# Patient Record
Sex: Female | Born: 1975
Health system: Southern US, Community
[De-identification: ages and names within clinical notes are randomized; demographics above are authoritative.]

## PROBLEM LIST (undated history)

## (undated) ENCOUNTER — Inpatient Hospital Stay (HOSPITAL_COMMUNITY): Payer: Self-pay

## (undated) DIAGNOSIS — R7989 Other specified abnormal findings of blood chemistry: Secondary | ICD-10-CM

## (undated) DIAGNOSIS — M545 Low back pain, unspecified: Secondary | ICD-10-CM

## (undated) DIAGNOSIS — M1990 Unspecified osteoarthritis, unspecified site: Secondary | ICD-10-CM

## (undated) DIAGNOSIS — M5412 Radiculopathy, cervical region: Secondary | ICD-10-CM

## (undated) DIAGNOSIS — I82409 Acute embolism and thrombosis of unspecified deep veins of unspecified lower extremity: Secondary | ICD-10-CM

## (undated) DIAGNOSIS — R200 Anesthesia of skin: Secondary | ICD-10-CM

## (undated) DIAGNOSIS — F329 Major depressive disorder, single episode, unspecified: Secondary | ICD-10-CM

## (undated) DIAGNOSIS — G5600 Carpal tunnel syndrome, unspecified upper limb: Secondary | ICD-10-CM

## (undated) DIAGNOSIS — R269 Unspecified abnormalities of gait and mobility: Secondary | ICD-10-CM

## (undated) DIAGNOSIS — R0609 Other forms of dyspnea: Secondary | ICD-10-CM

## (undated) DIAGNOSIS — R0600 Dyspnea, unspecified: Secondary | ICD-10-CM

## (undated) DIAGNOSIS — M19041 Primary osteoarthritis, right hand: Secondary | ICD-10-CM

## (undated) DIAGNOSIS — R350 Frequency of micturition: Secondary | ICD-10-CM

## (undated) DIAGNOSIS — R112 Nausea with vomiting, unspecified: Secondary | ICD-10-CM

## (undated) DIAGNOSIS — F32A Depression, unspecified: Secondary | ICD-10-CM

## (undated) DIAGNOSIS — F419 Anxiety disorder, unspecified: Secondary | ICD-10-CM

## (undated) DIAGNOSIS — Z9889 Other specified postprocedural states: Secondary | ICD-10-CM

## (undated) DIAGNOSIS — M79602 Pain in left arm: Secondary | ICD-10-CM

## (undated) DIAGNOSIS — M0579 Rheumatoid arthritis with rheumatoid factor of multiple sites without organ or systems involvement: Secondary | ICD-10-CM

## (undated) DIAGNOSIS — R760 Raised antibody titer: Secondary | ICD-10-CM

## (undated) DIAGNOSIS — Z789 Other specified health status: Secondary | ICD-10-CM

## (undated) HISTORY — DX: Dyspnea, unspecified: R06.00

## (undated) HISTORY — DX: Low back pain, unspecified: M54.50

## (undated) HISTORY — DX: Other forms of dyspnea: R06.09

## (undated) HISTORY — DX: Depression, unspecified: F32.A

## (undated) HISTORY — DX: Anxiety disorder, unspecified: F41.9

## (undated) HISTORY — PX: HERNIA REPAIR: SHX51

---
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
## 2002-05-05 ENCOUNTER — Encounter: Payer: Self-pay | Admitting: Emergency Medicine

## 2002-05-05 ENCOUNTER — Emergency Department (HOSPITAL_COMMUNITY): Admission: EM | Admit: 2002-05-05 | Discharge: 2002-05-05 | Payer: Self-pay | Admitting: Emergency Medicine

## 2003-09-06 ENCOUNTER — Emergency Department (HOSPITAL_COMMUNITY): Admission: EM | Admit: 2003-09-06 | Discharge: 2003-09-07 | Payer: Self-pay | Admitting: Emergency Medicine

## 2004-05-29 ENCOUNTER — Emergency Department (HOSPITAL_COMMUNITY): Admission: EM | Admit: 2004-05-29 | Discharge: 2004-05-29 | Payer: Self-pay | Admitting: Family Medicine

## 2004-06-02 ENCOUNTER — Other Ambulatory Visit: Admission: RE | Admit: 2004-06-02 | Discharge: 2004-06-02 | Payer: Self-pay | Admitting: Obstetrics and Gynecology

## 2005-11-23 ENCOUNTER — Emergency Department (HOSPITAL_COMMUNITY): Admission: EM | Admit: 2005-11-23 | Discharge: 2005-11-23 | Payer: Self-pay | Admitting: Emergency Medicine

## 2006-09-18 ENCOUNTER — Emergency Department (HOSPITAL_COMMUNITY): Admission: EM | Admit: 2006-09-18 | Discharge: 2006-09-19 | Payer: Self-pay | Admitting: Emergency Medicine

## 2006-11-12 ENCOUNTER — Other Ambulatory Visit: Admission: RE | Admit: 2006-11-12 | Discharge: 2006-11-12 | Payer: Self-pay | Admitting: Obstetrics and Gynecology

## 2007-01-04 ENCOUNTER — Emergency Department (HOSPITAL_COMMUNITY): Admission: EM | Admit: 2007-01-04 | Discharge: 2007-01-04 | Payer: Self-pay | Admitting: Emergency Medicine

## 2007-02-14 ENCOUNTER — Ambulatory Visit (HOSPITAL_COMMUNITY): Admission: RE | Admit: 2007-02-14 | Discharge: 2007-02-14 | Payer: Self-pay | Admitting: Obstetrics and Gynecology

## 2007-04-01 ENCOUNTER — Inpatient Hospital Stay (HOSPITAL_COMMUNITY): Admission: AD | Admit: 2007-04-01 | Discharge: 2007-04-01 | Payer: Self-pay | Admitting: Obstetrics & Gynecology

## 2007-05-14 ENCOUNTER — Ambulatory Visit (HOSPITAL_COMMUNITY): Admission: RE | Admit: 2007-05-14 | Discharge: 2007-05-14 | Payer: Self-pay | Admitting: Obstetrics & Gynecology

## 2007-06-05 ENCOUNTER — Inpatient Hospital Stay (HOSPITAL_COMMUNITY): Admission: AD | Admit: 2007-06-05 | Discharge: 2007-06-05 | Payer: Self-pay | Admitting: Obstetrics and Gynecology

## 2007-06-21 ENCOUNTER — Inpatient Hospital Stay (HOSPITAL_COMMUNITY): Admission: AD | Admit: 2007-06-21 | Discharge: 2007-06-21 | Payer: Self-pay | Admitting: Obstetrics and Gynecology

## 2007-07-03 ENCOUNTER — Inpatient Hospital Stay (HOSPITAL_COMMUNITY): Admission: AD | Admit: 2007-07-03 | Discharge: 2007-07-03 | Payer: Self-pay | Admitting: Obstetrics and Gynecology

## 2007-07-14 ENCOUNTER — Inpatient Hospital Stay (HOSPITAL_COMMUNITY): Admission: AD | Admit: 2007-07-14 | Discharge: 2007-07-14 | Payer: Self-pay | Admitting: Obstetrics and Gynecology

## 2007-07-21 ENCOUNTER — Inpatient Hospital Stay (HOSPITAL_COMMUNITY): Admission: RE | Admit: 2007-07-21 | Discharge: 2007-07-24 | Payer: Self-pay | Admitting: Obstetrics and Gynecology

## 2007-11-28 ENCOUNTER — Emergency Department (HOSPITAL_COMMUNITY): Admission: EM | Admit: 2007-11-28 | Discharge: 2007-11-28 | Payer: Self-pay | Admitting: Emergency Medicine

## 2008-06-05 IMAGING — US US TRANSVAGINAL NON-OB
1 series · 14 of 25 positions shown · non-contrast
Comparison: None

CLINICAL DATA: Lower abdominal pain

TRANSABDOMINAL AND TRANSVAGINAL PELVIC ULTRASOUND
TECHNIQUE: Both transabdominal and transvaginal ultrasound examinations of the
pelvis were performed including evaluation of the uterus, ovaries, adnexal
regions, and pelvic cul-de-sac.

[Series 1: unknown · 0.27mm/px · 14 of 41 slices shown]
[im 1/41]
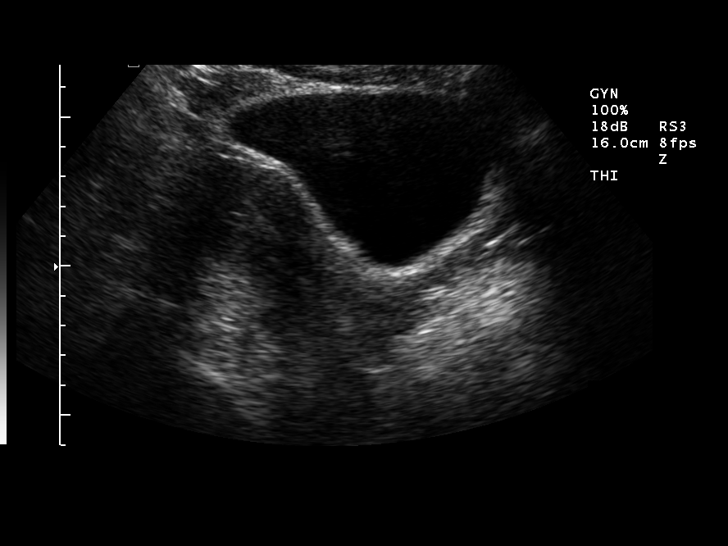
[im 4/41]
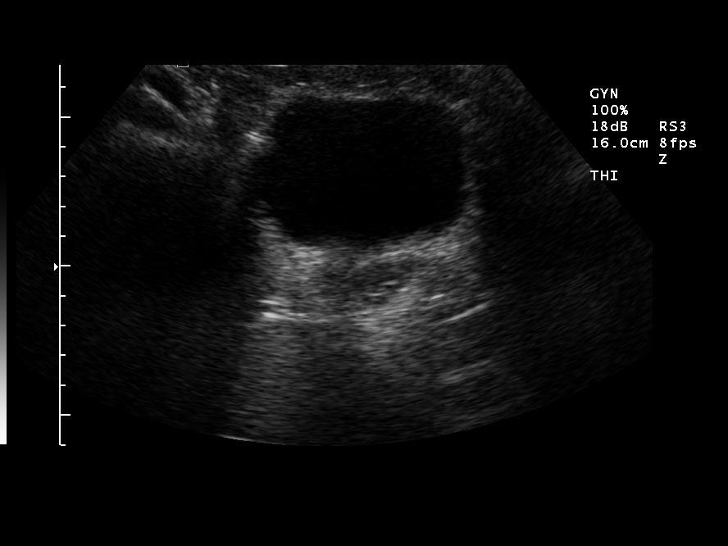
[im 7/41]
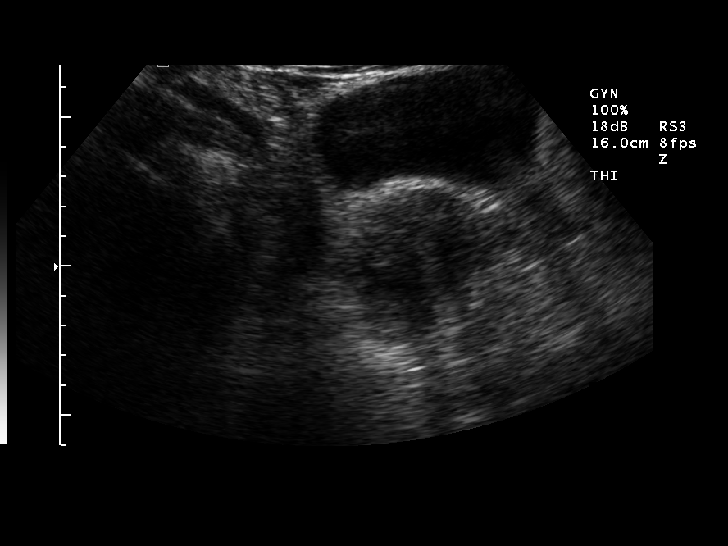
[im 11/41]
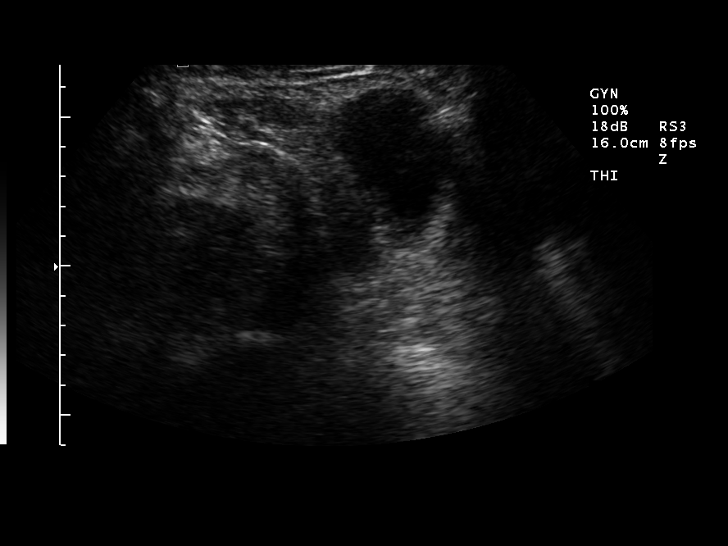
[im 14/41]
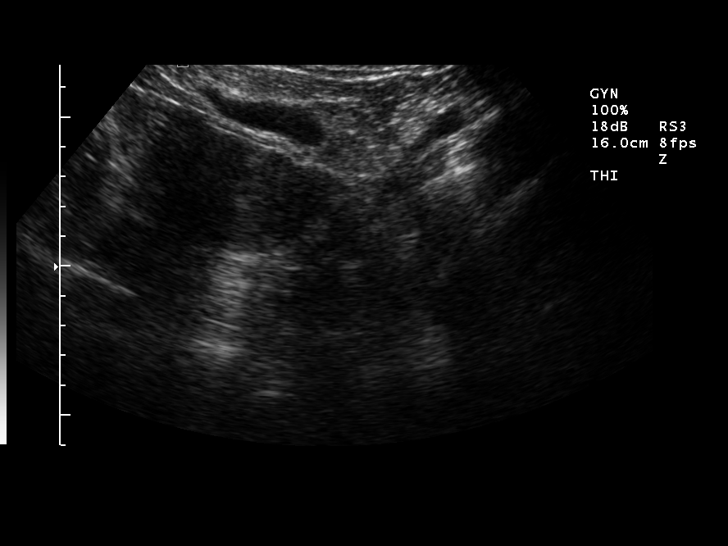
[im 16/41]
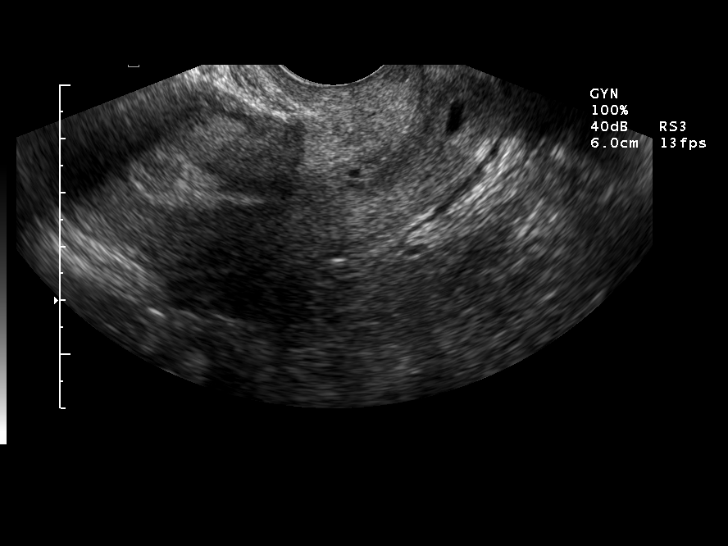
[im 19/41]
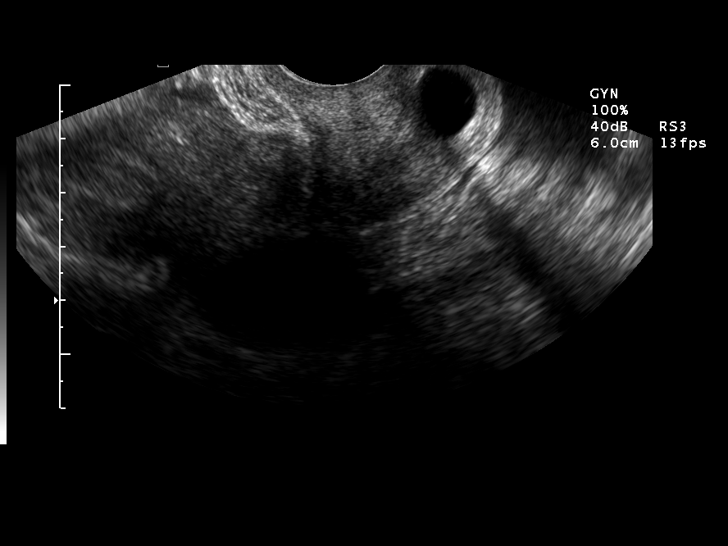
[im 22/41]
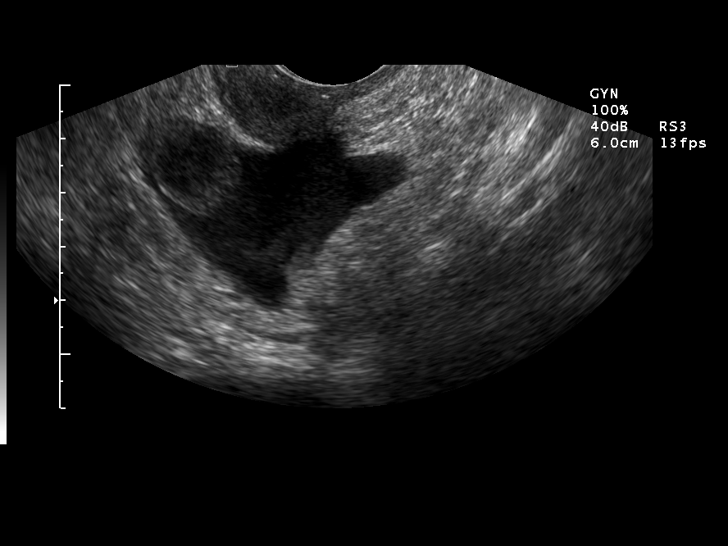
[im 26/41]
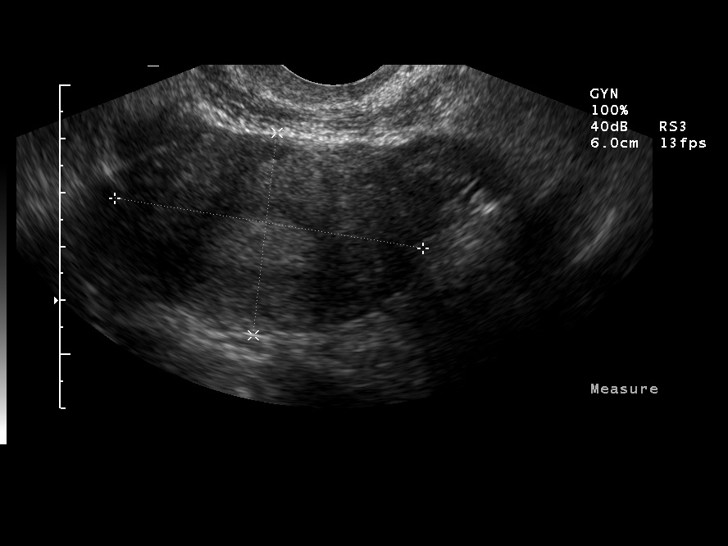
[im 27/41]
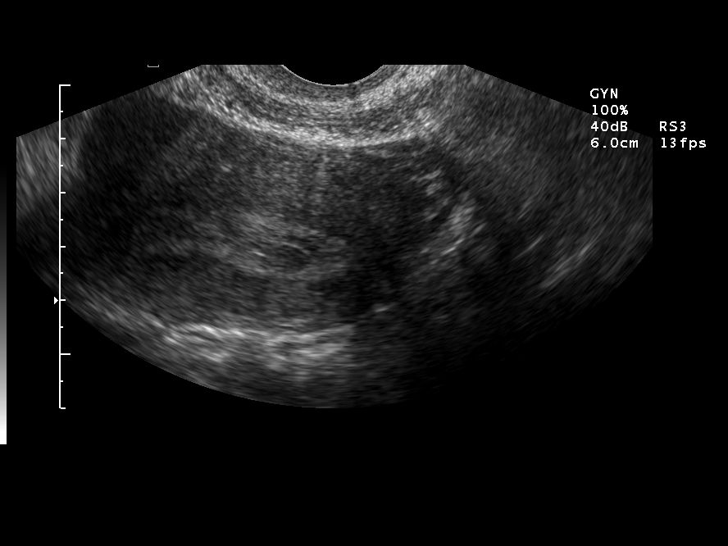
[im 31/41]
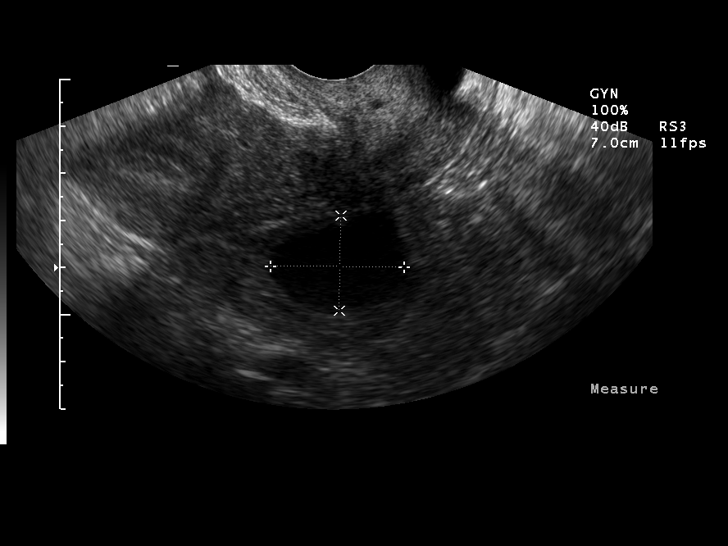
[im 34/41]
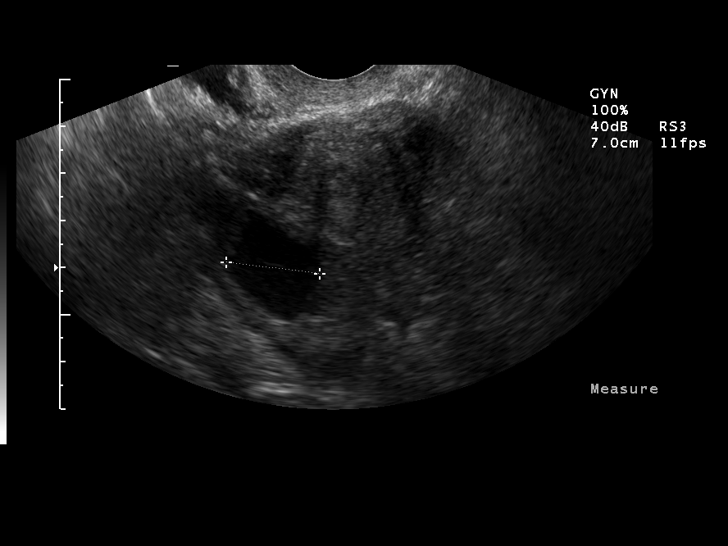
[im 37/41]
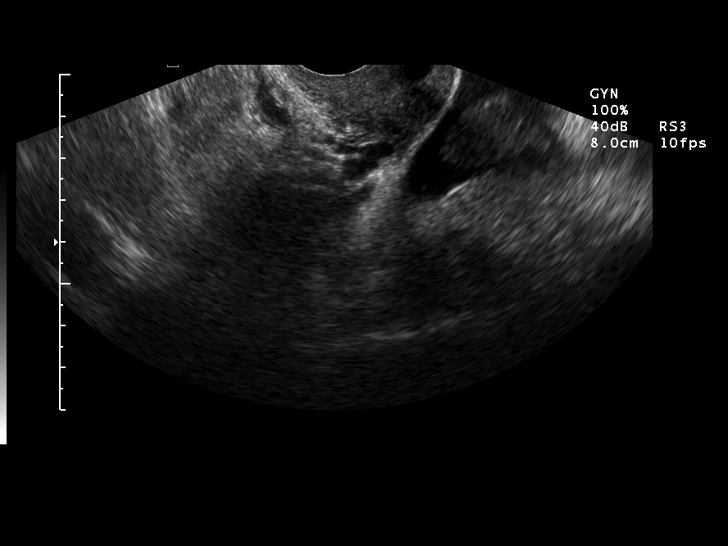
[im 41/41]
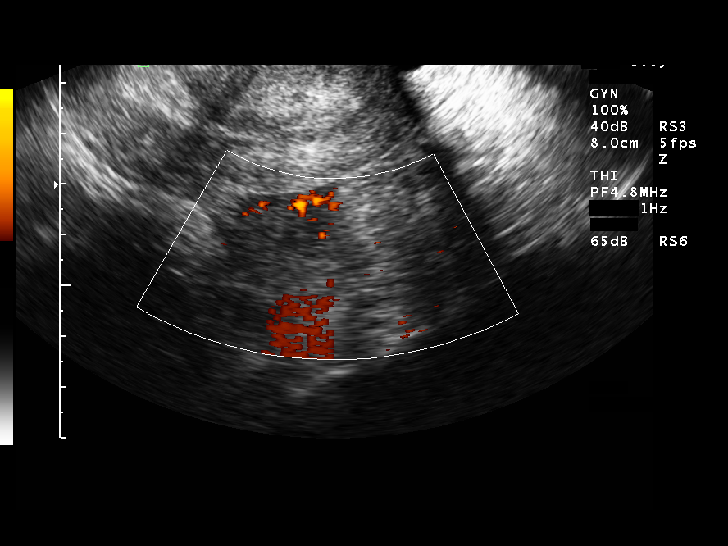

[14 of 25 positions shown; findings below may reference images not displayed]

FINDINGS: Uterus is unremarkable, measuring 8.2 x 3.8 x 5.8 cm. Endometrial
normal at 11 mm.

Left ovary normal. 2.8 cm simple appearing cyst noted in the right ovary.
Moderate free fluid seen in the pelvis.

IMPRESSION

2.8 cm right ovarian cyst.

Moderate free fluid.

## 2009-08-14 IMAGING — CR DG LUMBAR SPINE COMPLETE 4+V
6 series · 6 of 6 positions shown · non-contrast
Comparison: None available.

CLINICAL DATA: 32-year-old female status post fall with low back
pain.

LUMBAR SPINE - COMPLETE 4+ VIEW

[t l-spine a.p. *]
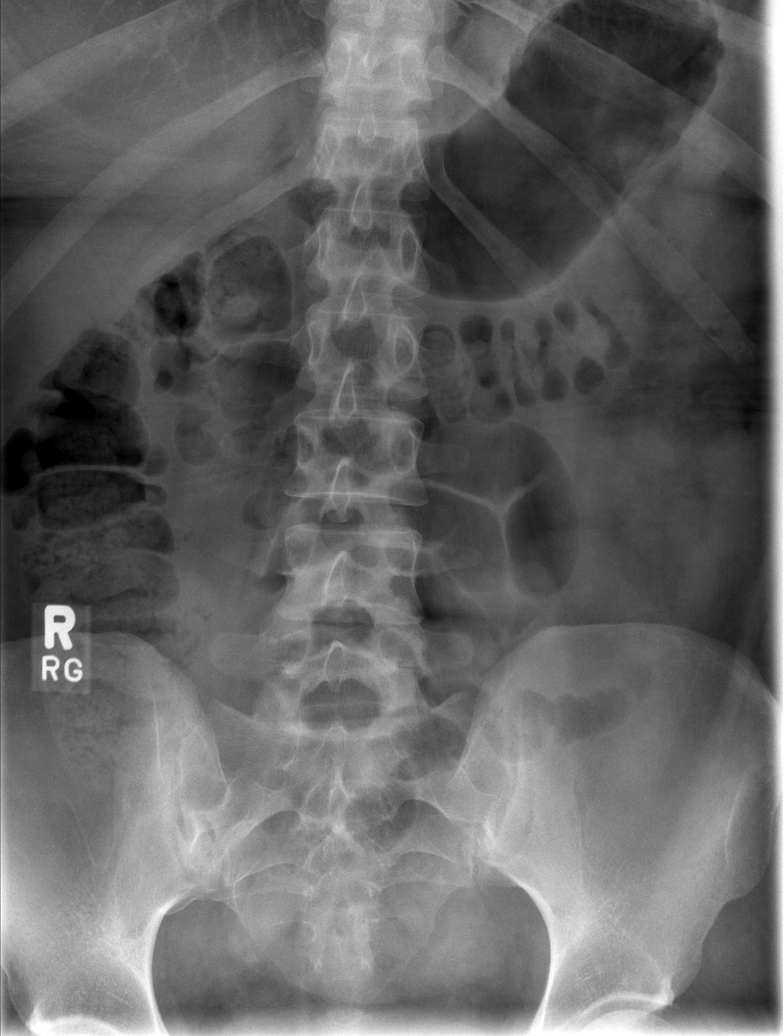

[t l-spine oblique exposure * (1 of 3)]
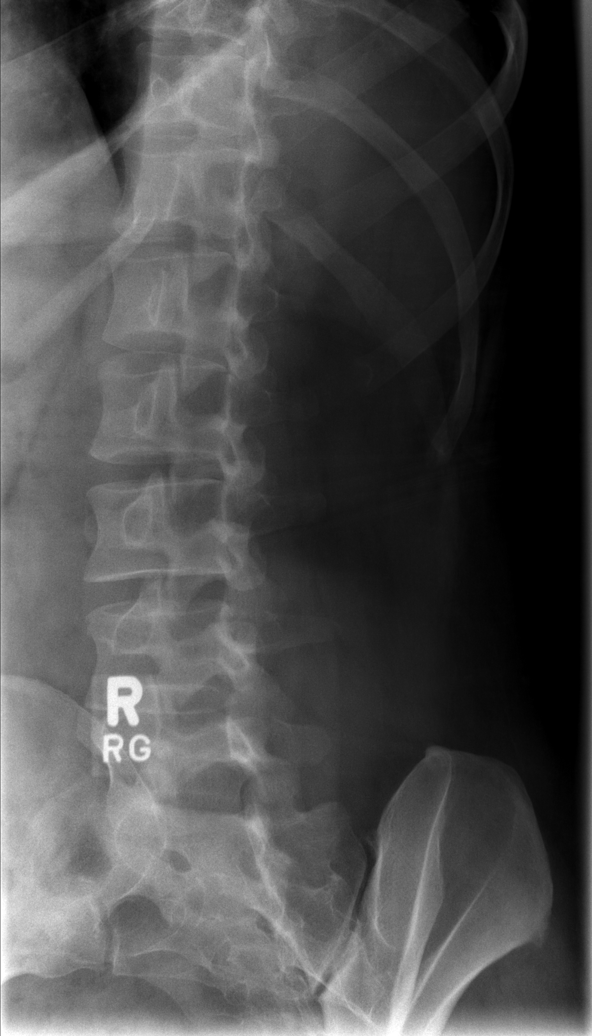

[t l-spine oblique exposure * (2 of 3)]
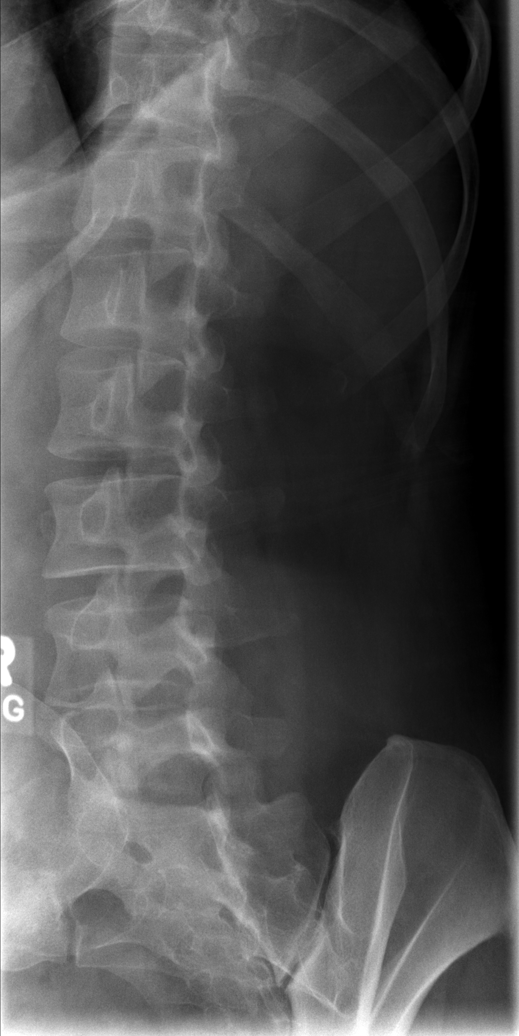

[t l-spine oblique exposure * (3 of 3)]
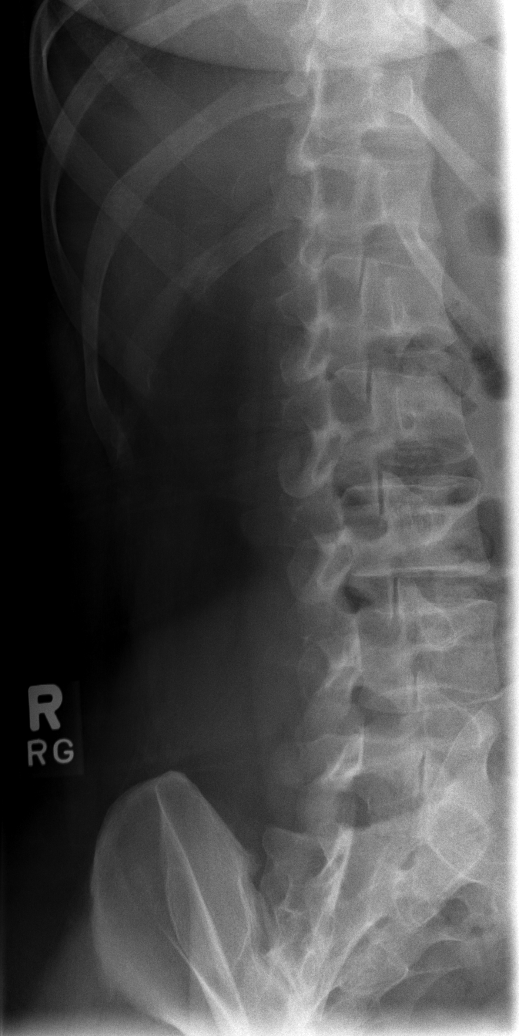

[t l-spine lat *]
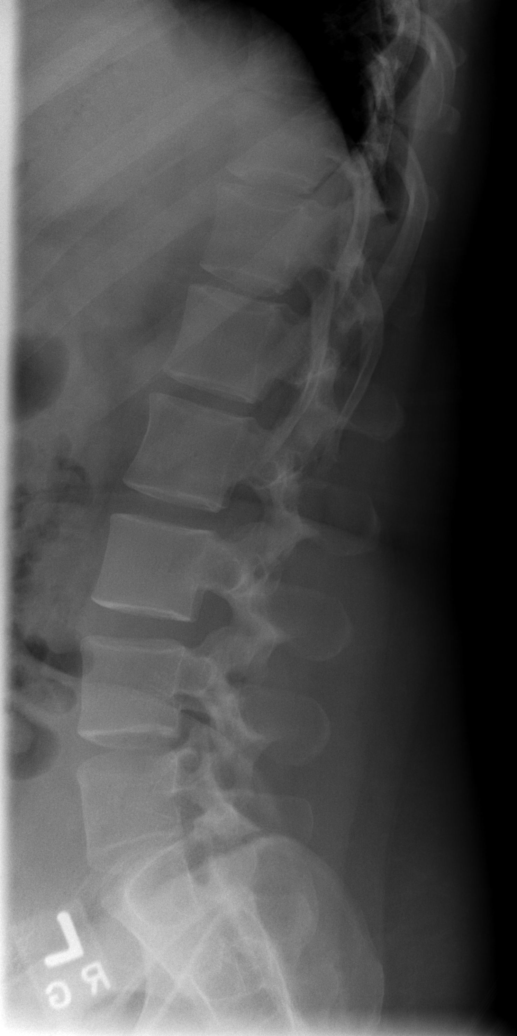

[t l-spine l5-s1 spot]
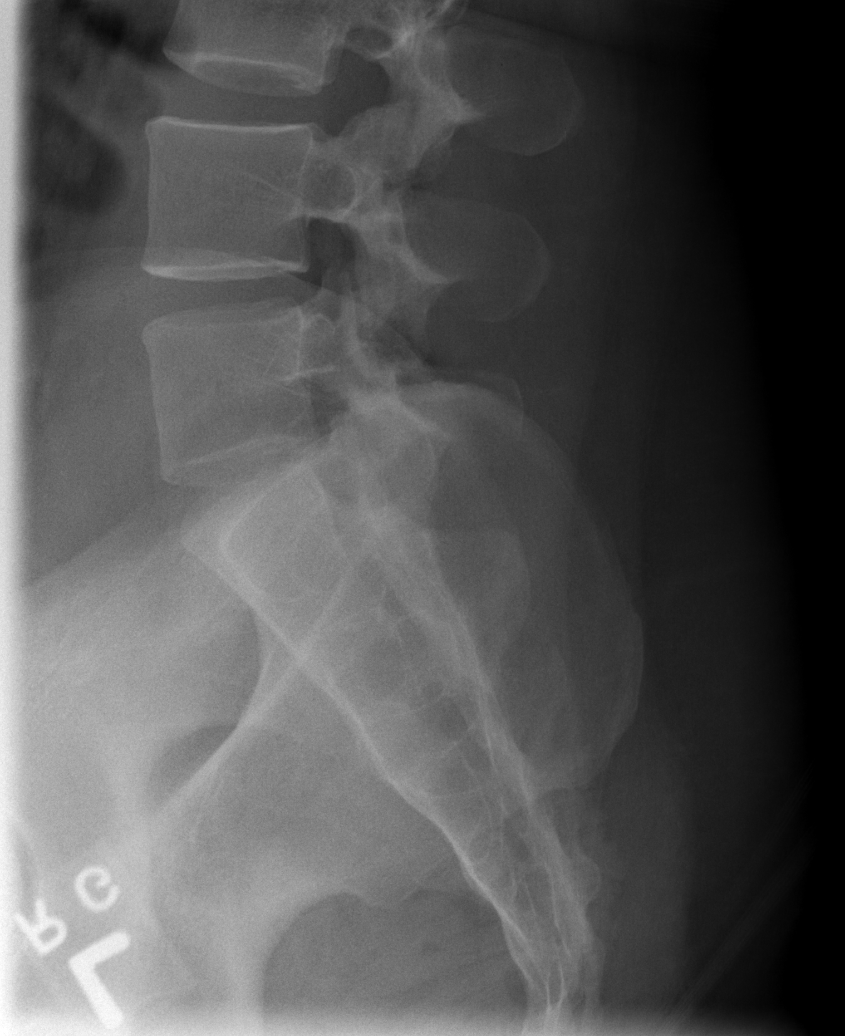

[6 of 6 positions shown; findings below may reference images not displayed]

FINDINGS: Normal lumbar segmentation. Bone mineralization is within
normal limits.  Preserved vertebral body height and alignment.
Mild disc space narrowing L5-S[DATE] be congenital.  No
spondylolisthesis.  No pars fracture.  Visualized sacrum, pelvis
and lower thoracic levels appear within normal limits.
IMPRESSION: No acute fracture or listhesis identified in the lumbar spine.

## 2009-11-16 ENCOUNTER — Emergency Department (HOSPITAL_COMMUNITY): Admission: EM | Admit: 2009-11-16 | Discharge: 2009-11-16 | Payer: Self-pay | Admitting: Emergency Medicine

## 2010-07-25 NOTE — Op Note (Signed)
Renee Howard, Renee Howard                 ACCOUNT NO.:  [id number]   MEDICAL RECORD NO.:  [id number]          PATIENT TYPE:  INP   LOCATION:  9125                          FACILITY:  [facility]   PHYSICIAN:  Kendra H. Tenny Craw, MD     DATE OF BIRTH:  Nov 06, 1975   DATE OF PROCEDURE:  07/21/2007  DATE OF DISCHARGE:                               OPERATIVE REPORT   PREOPERATIVE DIAGNOSES:  1. A 41-3/7 week intrauterine pregnancy.  2. Suspected macrosomia.   POSTOPERATIVE DIAGNOSES:  1. A 41-3/7 week intrauterine pregnancy.  2. Macrosomic infant.   PROCEDURE:  Primary low transverse cesarean section via Pfannenstiel's  skin incision.   SURGEON:  Freddrick March. Tenny Craw, MD   ASSISTANT:  None.   ANESTHESIA:  Spinal.   OPERATIVE FINDINGS:  Vigorous female infant in vertex presentation  weighing 9 pounds 9 ounces with Apgar scores of 9 and 9, normal-  appearing ovaries and tubes.   SPECIMENS:  Placenta for disposal.   ESTIMATED BLOOD LOSS:  1000 mL.   COMPLICATIONS:  None.   PROCEDURE:  Renee Howard is a 35 year old G1, P0 at 17 weeks and 3 days  gestational age who was admitted today for postdates induction of labor.  She had been measuring size greater than dates.  Two weeks ago, she had  an ultrasound which demonstrated an estimated fetal weight of 3757 g,  which was the 85th percentile with an AFI of 18.  Today on admission,  she again was subjectively measuring size greater than dates.  An  ultrasound was required to confirm fetal vertex.  The presenting part  was ballottable at the beginning of the induction.  After several hours  of Pitocin, cervical change had occurred; however, the presenting part  still remained ballottable.  The decision was made to obtain an  estimated fetal weight by ultrasound, which returned at 4580 g, which  was 10 pounds 2 ounces.  In discussion with the patient the risks,  benefits, and alternatives of a primary cesarean section for suspected  macrosomia versus a trial of  labor were discussed.  Given the fact the  fetal head was not engaged, there was concern that a vaginal delivery  could not be achieved.  After weighing her options, the patient elected  to proceed with a primary cesarean section for suspected macrosomia.   Following the appropriate informed consent, the patient was brought to  the operating room where spinal anesthesia was administered and found to  be adequate.  She was placed in the dorsal supine position in a leftward  tilt and prepped and draped in the usual sterile fashion.  Scalpel was  used to make a Pfannenstiel skin incision, which was carried down  through the underlying layers of soft tissue of the fascia.  The fascia  was incised in the midline.  Fascial incision was extended laterally  with the Mayo scissors.  Superior aspect of the fascial incision was  grasped with Kocher clamps x2 and tented up.  The underlying rectus  muscle was dissected off sharply with the Mayo scissors.  The same  procedure was repeated on the inferior aspect of the fascial incision.  The rectus muscles were then separated in the midline.  The abdominal  peritoneum was identified, tented up, and entered sharply with the  Metzenbaum and the incision was extended superiorly and inferiorly with  good visualization of the bladder.  The Alexis retractor was then  erected and the vesicouterine peritoneum was identified, tented up, and  entered sharply with the Metzenbaum and this incision was extended  laterally with the Metzenbaum and the bladder flap was created  digitally.  Scalpel was then used to make a low transverse incision,  which was extended laterally with blunt dissection.  Amniotomy was  performed for thin meconium fluid.  Bandage scissors were then used to  extend the uterine incision on the right hand aspect of the uterus.  When this occurred, a venous sinus was transected.  The fetal vertex was  identified and brought up through the uterine  incision.  A loose nuchal  cord x1 was reduced.  The infant's body was then delivered.  The cord  was clamped and cut and the infant was brought to the awaiting  pediatrician and isolette.  The placenta was then manually removed.  The  uterus could not be exteriorized for repair due to lack of visibility of  the lower uterine incision and was left in situ for repair.  Ring  forceps were used to clamp the bleeding venous sinus on the superior  aspect of the uterine incision where the uterine incision had been  extended.  A #1 chromic was used to repair the uterine incision followed  by several figure-of-8 sutures to achieve hemostasis on the right-hand  aspect of the uterus.  A second imbricating layer was then performed.  The uterine incision was found to be hemostatic.  The abdominal cavity  was then irrigated, cleared of all clot and debris.  Uterine incision  was reinspected and again found to be hemostatic.  The abdominal  peritoneum was then reapproximated with 2-0 Vicryl in a running fashion.  The fascia was then closed with 0 looped PDS in a running fashion.  Nesacaine 3% was then placed in the skin incision to augment anesthesia  and the skin was closed with staples.  All sponge, lap, and needle  counts were correct x2.  The patient tolerated the procedure well and  was brought to the recovery room in stable condition following the  procedure.      Freddrick March. Tenny Craw, MD  Electronically Signed     KHR/MEDQ  D:  07/21/2007  T:  07/22/2007  Job:  [id number]

## 2010-07-28 NOTE — Discharge Summary (Signed)
Renee Howard, FITTING NO.:  [id number]   MEDICAL RECORD NO.:  [id number]          PATIENT TYPE:  INP   LOCATION:  9125                          FACILITY:  [facility]   PHYSICIAN:  Randye Lobo, M.D.   DATE OF BIRTH:  20-Mar-1975   DATE OF ADMISSION:  07/21/2007  DATE OF DISCHARGE:  07/24/2007                               DISCHARGE SUMMARY   FINAL DIAGNOSES:  1. Intrauterine pregnancy at 41-3/7 weeks' gestation.  2. Macrosomia.   PROCEDURE:  Primary low transverse cesarean section.   SURGEON:  Freddrick March. Tenny Craw, MD   COMPLICATIONS:  None.   HISTORY:  This 35 year old G1, P0 presents at 41-3/7 weeks' gestation  for induction secondary to post dates.  The patient had been measuring  size greater than dates.  She had an ultrasound about 2 weeks ago which  already showed an estimated fetal weight of about 3700 g.  On admission,  at this point, she was still measuring large.  Otherwise, the patient's  antepartum course up to this point had been uncomplicated.  The  patient's cervix was 1-2 cm of dilation upon admission and was  ballottable.  An estimated fetal weight by ultrasound showed the baby  was about 4500 grams.  Given the fact that the fetal head is not engaged  and the size of the baby, a decision was made to proceed with a cesarean  section.  The patient was taken to the operating room by Dr. Waynard Reeds  on Jul 21, 2007, where a primary low transverse cesarean section was  performed with the delivery of a 9 pound 9 ounce female infant with Apgars  of 9 and 9.  The delivery went without complications.  The patient's  postoperative course was benign without any significant fevers.  The  patient was felt ready for discharge on postoperative day #3.  She was  sent home on a regular diet, told to decrease her activities.  She was  given Vicodin 1 to 2 every 6 hours as needed for pain, told she should  also continue her prenatal vitamins.  She could use ibuprofen up to  600  mg every 6 hours as needed for pain.   FOLLOWUP:  She is to follow up in our office on Monday for removal of  her staples.  Instructions and precautions were reviewed with the  patient.   LABS ON DISCHARGE:  The patient had a hemoglobin of 10.5, white blood  cell count of 12.0, and platelets of 286,000.     Renee Howard, P.A.-C.      Randye Lobo, M.D.  Electronically Signed   MB/MEDQ  D:  08/15/2007  T:  08/16/2007  Job:  [id number]

## 2010-11-30 LAB — URINALYSIS, ROUTINE W REFLEX MICROSCOPIC
Bilirubin Urine: NEGATIVE
Glucose, UA: NEGATIVE
Hgb urine dipstick: NEGATIVE
Ketones, ur: NEGATIVE
Protein, ur: NEGATIVE
pH: 6.5

## 2010-11-30 LAB — WET PREP, GENITAL
Clue Cells Wet Prep HPF POC: NONE SEEN
Trich, Wet Prep: NONE SEEN
Yeast Wet Prep HPF POC: NONE SEEN

## 2010-11-30 LAB — FETAL FIBRONECTIN: Fetal Fibronectin: NEGATIVE

## 2010-12-11 LAB — URINALYSIS, ROUTINE W REFLEX MICROSCOPIC
Hgb urine dipstick: NEGATIVE
Nitrite: NEGATIVE
Protein, ur: NEGATIVE
Specific Gravity, Urine: 1.01
Urobilinogen, UA: 0.2

## 2010-12-11 LAB — POCT PREGNANCY, URINE: Preg Test, Ur: NEGATIVE

## 2010-12-20 LAB — D-DIMER, QUANTITATIVE: D-Dimer, Quant: 0.42

## 2010-12-26 LAB — DIFFERENTIAL
Basophils Absolute: 0.1
Basophils Relative: 1
Eosinophils Absolute: 0.1
Monocytes Absolute: 0.6
Monocytes Relative: 6

## 2010-12-26 LAB — BASIC METABOLIC PANEL
CO2: 26
Chloride: 102
Creatinine, Ser: 0.71
GFR calc Af Amer: >60
Glucose, Bld: 100 — ABNORMAL HIGH
Sodium: 134 — ABNORMAL LOW

## 2010-12-26 LAB — URINALYSIS, ROUTINE W REFLEX MICROSCOPIC
Bilirubin Urine: NEGATIVE
Glucose, UA: NEGATIVE
Hgb urine dipstick: NEGATIVE
Protein, ur: NEGATIVE
Urobilinogen, UA: 0.2

## 2010-12-26 LAB — CBC
Hemoglobin: 13.2
MCHC: 34.3
MCV: 82.9
RBC: 4.66
RDW: 13.6

## 2010-12-26 LAB — GC/CHLAMYDIA PROBE AMP, GENITAL: Chlamydia, DNA Probe: NEGATIVE

## 2011-05-10 ENCOUNTER — Encounter (HOSPITAL_COMMUNITY): Payer: Self-pay | Admitting: *Deleted

## 2011-05-10 ENCOUNTER — Emergency Department (HOSPITAL_COMMUNITY)
Admission: EM | Admit: 2011-05-10 | Discharge: 2011-05-10 | Disposition: A | Discharge status: Home / Self Care | Payer: BC Managed Care – PPO | Attending: Emergency Medicine | Admitting: Emergency Medicine

## 2011-05-10 DIAGNOSIS — N949 Unspecified condition associated with female genital organs and menstrual cycle: Secondary | ICD-10-CM | POA: Insufficient documentation

## 2011-05-10 DIAGNOSIS — R102 Pelvic and perineal pain: Secondary | ICD-10-CM

## 2011-05-10 DIAGNOSIS — O99891 Other specified diseases and conditions complicating pregnancy: Secondary | ICD-10-CM | POA: Insufficient documentation

## 2011-05-10 LAB — URINALYSIS, ROUTINE W REFLEX MICROSCOPIC
Bilirubin Urine: NEGATIVE
Glucose, UA: NEGATIVE mg/dL
Hgb urine dipstick: NEGATIVE
Ketones, ur: NEGATIVE mg/dL
Protein, ur: NEGATIVE mg/dL

## 2011-05-10 LAB — WET PREP, GENITAL
Trich, Wet Prep: NONE SEEN
Yeast Wet Prep HPF POC: NONE SEEN

## 2011-05-10 NOTE — Discharge Instructions (Signed)
You may take Tylenol up to 1000 mg every 6 hours for pain. Return to the emergency department at [facility] hospital immediately should he develop worsening pain.

## 2011-05-10 NOTE — ED Provider Notes (Signed)
History     CSN: [id number]  Arrival date & time 05/10/11  0031   First MD Initiated Contact with Patient 05/10/11 0234      Chief Complaint  Patient presents with  . Pelvic Pain    (Consider location/radiation/quality/duration/timing/severity/associated sxs/prior treatment) HPI Comments: G2 P1 female at approximately [redacted] weeks pregnant who presents with pelvic pain. She states that this has been going on for approximately 8 days, it is a burning pain that she feels in her vagina which she states is constant, worse with urinating, wiping but has a constant abnormal feeling. She denies nausea vomiting diarrhea, vaginal bleeding, vaginal discharge. She has been seen by her OB/GYN proximally 6 days ago, had a pelvic exam without any samples obtained and told that there was nothing abnormal after a pelvic ultrasound reveals an intrauterine pregnancy with an estimated due date is approximately 12/22/2011. Symptoms are mild at this time, no associated fevers. She denies prior abdominal surgeries and has a history of one healthy term vaginal delivery in the past.  Patient is a 36 y.o. female presenting with pelvic pain. The history is provided by the patient.  Pelvic Pain    History reviewed. No pertinent past medical history.  History reviewed. No pertinent past surgical history.  History reviewed. No pertinent family history.  History  Substance Use Topics  . Smoking status: Not on file  . Smokeless tobacco: Not on file  . Alcohol Use:     OB History    Grav Para Term Preterm Abortions TAB SAB Ect Mult Living   1               Review of Systems  Genitourinary: Positive for pelvic pain.  All other systems reviewed and are negative.    Allergies  Review of patient's allergies indicates no known allergies.  Home Medications   Current Outpatient Rx  Name Route Sig Dispense Refill  . PRENATAL 27-0.8 MG PO TABS Oral Take 1 tablet by mouth daily.      BP 112/64  Pulse 92   Temp(Src) 98.2 F (36.8 C) (Oral)  Resp 20  SpO2 100%  Physical Exam  Nursing note and vitals reviewed. Constitutional: She appears well-developed and well-nourished. No distress.  HENT:  Head: Normocephalic and atraumatic.  Mouth/Throat: Oropharynx is clear and moist. No oropharyngeal exudate.  Eyes: Conjunctivae and EOM are normal. Pupils are equal, round, and reactive to light. Right eye exhibits no discharge. Left eye exhibits no discharge. No scleral icterus.  Neck: Normal range of motion. Neck supple. No JVD present. No thyromegaly present.  Cardiovascular: Normal rate, regular rhythm, normal heart sounds and intact distal pulses.  Exam reveals no gallop and no friction rub.   No murmur heard. Pulmonary/Chest: Effort normal and breath sounds normal. No respiratory distress. She has no wheezes. She has no rales.  Abdominal: Soft. Bowel sounds are normal. She exhibits no distension and no mass. There is no tenderness.  Musculoskeletal: Normal range of motion. She exhibits no edema and no tenderness.  Lymphadenopathy:    She has no cervical adenopathy.  Neurological: She is alert. Coordination normal.  Skin: Skin is warm and dry. No rash noted. No erythema.  Psychiatric: She has a normal mood and affect. Her behavior is normal.    ED Course  Procedures (including critical care time)   Labs Reviewed  WET PREP, GENITAL  URINALYSIS, ROUTINE W REFLEX MICROSCOPIC  GC/CHLAMYDIA PROBE AMP, GENITAL   No results found.   1. Pelvic pain  MDM  Patient has no tenderness on pelvic exam, has normal external genitalia, no prolapse, no discharge, no bleeding, no cervical motion tenderness, no adnexal tenderness. Wet prep shows no significant findings, urinalysis is clean. She does have followup with her OB/GYN for her back her OB/GYN at this time. I doubt that this is related to appendicitis or other abdominal pathology. I have encouraged her to return to emergency department  immediately for severe or worsening pain.  He states that she does get good pain relief with Tylenol and thus has declined any other medications        Vida Roller, MD 05/10/11 [phone]

## 2011-05-10 NOTE — ED Notes (Signed)
Pt in c/o pelvic pain since last Thursday, states she is approx 6-[redacted] weeks pregnant, went to her OBGYN for same pain last week and they did not find anything wrong

## 2011-05-12 LAB — GC/CHLAMYDIA PROBE AMP, GENITAL
Chlamydia, DNA Probe: UNDETERMINED
GC Probe Amp, Genital: NEGATIVE

## 2011-05-23 ENCOUNTER — Inpatient Hospital Stay (HOSPITAL_COMMUNITY)
Admission: AD | Admit: 2011-05-23 | Discharge: 2011-05-23 | Disposition: A | Discharge status: Home / Self Care | Payer: BC Managed Care – PPO | Attending: Obstetrics and Gynecology | Admitting: Obstetrics and Gynecology

## 2011-05-23 ENCOUNTER — Encounter (HOSPITAL_COMMUNITY): Payer: Self-pay | Admitting: *Deleted

## 2011-05-23 DIAGNOSIS — R197 Diarrhea, unspecified: Secondary | ICD-10-CM | POA: Insufficient documentation

## 2011-05-23 DIAGNOSIS — M545 Low back pain, unspecified: Secondary | ICD-10-CM | POA: Insufficient documentation

## 2011-05-23 DIAGNOSIS — O26859 Spotting complicating pregnancy, unspecified trimester: Secondary | ICD-10-CM | POA: Insufficient documentation

## 2011-05-23 DIAGNOSIS — J111 Influenza due to unidentified influenza virus with other respiratory manifestations: Secondary | ICD-10-CM

## 2011-05-23 DIAGNOSIS — O210 Mild hyperemesis gravidarum: Secondary | ICD-10-CM | POA: Insufficient documentation

## 2011-05-23 DIAGNOSIS — R05 Cough: Secondary | ICD-10-CM | POA: Insufficient documentation

## 2011-05-23 DIAGNOSIS — O4690 Antepartum hemorrhage, unspecified, unspecified trimester: Secondary | ICD-10-CM

## 2011-05-23 DIAGNOSIS — R059 Cough, unspecified: Secondary | ICD-10-CM | POA: Insufficient documentation

## 2011-05-23 HISTORY — DX: Other specified health status: Z78.9

## 2011-05-23 LAB — CBC
HCT: 38 % (ref 36.0–46.0)
Hemoglobin: 12.9 g/dL (ref 12.0–15.0)
MCHC: 33.9 g/dL (ref 30.0–36.0)
MCV: 80 fL (ref 78.0–100.0)

## 2011-05-23 LAB — URINE MICROSCOPIC-ADD ON

## 2011-05-23 LAB — COMPREHENSIVE METABOLIC PANEL
Alkaline Phosphatase: 73 U/L (ref 39–117)
BUN: 8 mg/dL (ref 6–23)
GFR calc Af Amer: >90 mL/min (ref >90)
Glucose, Bld: 105 mg/dL — ABNORMAL HIGH (ref 70–99)
Potassium: 3.3 mEq/L — ABNORMAL LOW (ref 3.5–5.1)
Total Bilirubin: 0.2 mg/dL — ABNORMAL LOW (ref 0.3–1.2)
Total Protein: 6.8 g/dL (ref 6.0–8.3)

## 2011-05-23 LAB — URINALYSIS, ROUTINE W REFLEX MICROSCOPIC
Glucose, UA: NEGATIVE mg/dL
Ketones, ur: 15 mg/dL — AB
Leukocytes, UA: NEGATIVE
pH: 6 (ref 5.0–8.0)

## 2011-05-23 MED ORDER — OSELTAMIVIR PHOSPHATE 75 MG PO CAPS: 75.0000 mg | ORAL_CAPSULE | Freq: Two times a day (BID) | ORAL | Status: AC

## 2011-05-23 MED ORDER — ONDANSETRON 8 MG PO TBDP
8.0000 mg | ORAL_TABLET | Freq: Once | ORAL | Status: AC
  Administered 2011-05-23: 8 mg via ORAL
  Filled 2011-05-23: qty 1

## 2011-05-23 MED ORDER — BENZONATATE 100 MG PO CAPS: 100.0000 mg | ORAL_CAPSULE | Freq: Three times a day (TID) | ORAL | Status: AC

## 2011-05-23 NOTE — MAU Note (Signed)
PT SAYS  PINK D/C STARTED MID- DAY Tuesday.  THIS MORNING - RED  BLOOD IN PANTIES.  WENT TO B-ROOM-  THEN PINK D/C  WITH STREAKS OF RED.  PAD ON NOW.    NO CRAMPS - BUT SAYS BACK HURTS-  STARTED LAST NIGHT.   LAST SEX WAS IN JAN.   WAS SEEN IN OFFICE  ON Friday.  YESTERDAY WENT   MINUTE- CLINIC  AT [facility] - FOR FLU-   SHE DECLINED FLU TEST-  THEY TOLD  HER TO TAKE TYLENOL AND DRINK FLUIDS.   Marland Kitchen  PT HAS COUGH- PROD-  SOMETIMES VOMITS.      HAS SORE THROAT.    HAD VOMITING  LAST NIGHT.  HAD DIARRHEA-   RUNNY STOOLS.  TEMP AT HOME WAS 100 AND 101.  DID NOT TAKE TEMP THIS AM.

## 2011-05-23 NOTE — MAU Note (Signed)
WENT TO OFFICE -   HAD U/S  AND PELVIC EXAM-  LAST FRIDAY

## 2011-05-23 NOTE — Discharge Instructions (Signed)
Influenza, Adult Influenza ("the flu") is a viral infection of the respiratory tract. It causes chills, fever, cough, headache, body aches, and sore throat. Influenza in general will make you feel sicker than when you have a common cold. Symptoms of the illness typically last a few days. Cough and fatigue may continue for as long as 7 to 10 days. Influenza is highly contagious. It spreads easily to others in the droplets from coughs and sneezes. People frequently become infected by touching something that was recently contaminated with the virus and then touch their mouth, nose or eyes. This infection is caused by a virus. Symptoms will not be reduced or improved by taking an antibiotic. Antibiotics are medications that kill bacteria, not viruses. DIAGNOSIS  Diagnosis of influenza is often made based on the history and physical examination as well as the presence of influenza reports occurring in your community. Testing can be done if the diagnosis is not certain. TREATMENT  Since influenza is caused by a virus, antibiotics are not helpful. Your caregiver may prescribe antiviral medicines to shorten the illness and lessen the severity. Your caregiver may also recommend influenza vaccination and/or antiviral medicines for your family members in order to prevent the spread of influenza to them. HOME CARE INSTRUCTIONS  DO NOT GIVE ASPIRIN TO PERSONS WITH INFLUENZA WHO ARE UNDER 55 YEARS OF AGE. This could lead to brain and liver damage (Reye's syndrome). Read the label on over-the-counter medicines.   Stay home from work or school if at all possible until most of your symptoms are gone.   Only take over-the-counter or prescription medicines for pain, discomfort, or fever as directed by your caregiver.   Use a cool mist humidifier to increase air moisture. This will make breathing easier.   Rest until your temperature is nearly normal: 98.6 F (37 C). This usually takes 3 to 4 days. Be sure you get  plenty of rest.   Drink at least eight, eight-ounce glasses of fluids per day. Fluids include water, juice, broth, gelatin, or lemonade.   Cover your mouth and nose when coughing or sneezing and wash your hands often to prevent the spread of this virus to other persons.  PREVENTION  Annual influenza vaccination (flu shots) is the best way to avoid getting influenza. An annual flu shot is now routinely recommended for all adults in the [place] SEEK MEDICAL CARE IF:   You develop shortness of breath while resting.   You have a deep cough with production of mucous or chest pain.   You develop nausea (feeling sick to your stomach), vomiting, or diarrhea.  SEEK IMMEDIATE MEDICAL CARE IF:   You have difficulty breathing, become short of breath, or your skin or nails turn bluish.   You develop severe neck pain or stiffness.   You develop a severe headache, facial pain, or earache.   You have a fever.   You develop nausea or vomiting that cannot be controlled.  Document Released: 02/24/2000 Document Revised: 02/15/2011 Document Reviewed: 12/29/2008 [facility] Patient Information 2012 [place], [place].Vaginal Bleeding During Pregnancy A small amount of bleeding from the vagina can happen anytime during pregnancy. Be sure to tell your doctor about all vaginal bleeding.  HOME CARE  Get plenty of rest and sleep.   Count the number of pads you use each day. Do not use tampons.   Save any tissue you pass for your doctor to see.   Do not exercise   Do not do any heavy lifting.   Avoid  going up and down stairs. If you must climb stairs, go slowly.   Do not have sex (intercourse) or orgasms until approved by your doctor.   Do not douche.   Only take medicine as told by your doctor. Do not take aspirin.   Eat healthy.   Always keep your follow-up appointments.  GET HELP RIGHT AWAY IF:   You feel the baby moving less or not moving at all.   The bleeding gets worse.   You have very  painful cramps or pain in your stomach or back.   You pass large clots or anything that looks like tissue.   You have a temperature by mouth above 102 F (38.9 C).   You feel very weak.   You have chills.   You feel dizzy or pass out (faint).   You have a gush of fluid from the vagina.  MAKE SURE YOU:   Understand these instructions.   Will watch your condition.   Will get help right away if you are not doing well or get worse.  Document Released: 12/06/2007 Document Revised: 02/15/2011 Document Reviewed: 02/01/2009 [facility] Patient Information 2012 [place], [place].

## 2011-05-23 NOTE — MAU Provider Note (Signed)
History     CSN: [id number]  Arrival date and time: 05/23/11 4098   First Provider Initiated Contact with Patient 05/23/11 0741      No chief complaint on file.  HPI 36 y.o. G2P1001 at [redacted]w[redacted]d with spotting this morning, some occasional cramping, low back pain. Also c/o cough, nausea, vomiting and diarrhea since Saturday, fever up to 101. Taking Tylenol only. Had u/s confirming IUP in office per patient, had pelvic exam on Friday.    Past Medical History  Diagnosis Date  . No pertinent past medical history     Past Surgical History  Procedure Date  . Cesarean section   . Hernia repair     History reviewed. No pertinent family history.  History  Substance Use Topics  . Smoking status: Never Smoker   . Smokeless tobacco: Not on file  . Alcohol Use: No    Allergies: No Known Allergies  Prescriptions prior to admission  Medication Sig Dispense Refill  . acetaminophen (TYLENOL) 500 MG tablet Take 1,000 mg by mouth every 6 (six) hours as needed. For pain      . Prenatal Vit-Fe Fumarate-FA (MULTIVITAMIN-PRENATAL) 27-0.8 MG TABS Take 1 tablet by mouth every morning.         Review of Systems  Constitutional: Positive for fever.  Respiratory: Negative.   Cardiovascular: Negative.   Gastrointestinal: Positive for nausea, vomiting, abdominal pain and diarrhea. Negative for constipation.  Genitourinary: Positive for frequency. Negative for dysuria, urgency, hematuria and flank pain.       Positive for vaginal bleeding  Musculoskeletal: Negative.   Neurological: Negative.   Psychiatric/Behavioral: Negative.    Physical Exam   Blood pressure 109/73, pulse 120, temperature 98.2 F (36.8 C), temperature source Oral, resp. rate 22, height 5\' 10"  (1.778 m), weight 285 lb 6 oz (129.445 kg).  Physical Exam  Nursing note and vitals reviewed. Constitutional: She is oriented to person, place, and time. She appears well-developed and well-nourished. No distress.  HENT:  Head:  Normocephalic and atraumatic.  Cardiovascular: Normal rate.   Respiratory: Effort normal.  Genitourinary: Guaiac stool: clear. There is no rash or lesion on the right labia. There is no rash or lesion on the left labia. Cervix exhibits friability. Cervix exhibits no motion tenderness and no discharge. No tenderness or bleeding around the vagina. Vaginal discharge found.       Generalized vulvar irriation  Musculoskeletal: Normal range of motion.  Neurological: She is alert and oriented to person, place, and time.  Skin: Skin is warm and dry.  Psychiatric: She has a normal mood and affect.    MAU Course  Procedures  Bedside u/s: + FHR  Assessment and Plan  36 y.o. G2P1001 at [redacted]w[redacted]d Spotting N/V/D - Zofran ODT 8 mg, UA, CBC and CMP pending Care assumed by Henrietta Hoover, PA  [facility] 05/23/2011, 7:49 AM   I have assumed care of this pt from Georges Mouse, CNM.  Results for orders placed during the hospital encounter of 05/23/11 (from the past 24 hour(s))  URINALYSIS, ROUTINE W REFLEX MICROSCOPIC     Status: Abnormal   Collection Time   05/23/11  7:30 AM      Component Value Range   Color, Urine YELLOW  YELLOW    APPearance CLEAR  CLEAR    Specific Gravity, Urine 1.025  1.005 - 1.030    pH 6.0  5.0 - 8.0    Glucose, UA NEGATIVE  NEGATIVE (mg/dL)   Hgb urine dipstick SMALL (*) NEGATIVE  Bilirubin Urine SMALL (*) NEGATIVE    Ketones, ur 15 (*) NEGATIVE (mg/dL)   Protein, ur 30 (*) NEGATIVE (mg/dL)   Urobilinogen, UA 0.2  0.0 - 1.0 (mg/dL)   Nitrite NEGATIVE  NEGATIVE    Leukocytes, UA NEGATIVE  NEGATIVE   URINE MICROSCOPIC-ADD ON     Status: Abnormal   Collection Time   05/23/11  7:30 AM      Component Value Range   Squamous Epithelial / LPF RARE  RARE    WBC, UA 0-2  <3 (WBC/hpf)   Bacteria, UA FEW (*) RARE    Urine-Other MUCOUS PRESENT    CBC     Status: Normal   Collection Time   05/23/11  8:05 AM      Component Value Range   WBC 4.3  4.0 - 10.5 (K/uL)   RBC  4.75  3.87 - 5.11 (MIL/uL)   Hemoglobin 12.9  12.0 - 15.0 (g/dL)   HCT 16.1  09.6 - 04.5 (%)   MCV 80.0  78.0 - 100.0 (fL)   MCH 27.2  26.0 - 34.0 (pg)   MCHC 33.9  30.0 - 36.0 (g/dL)   RDW 40.9  81.1 - 91.4 (%)   Platelets 169  150 - 400 (K/uL)  COMPREHENSIVE METABOLIC PANEL     Status: Abnormal   Collection Time   05/23/11  8:05 AM      Component Value Range   Sodium 132 (*) 135 - 145 (mEq/L)   Potassium 3.3 (*) 3.5 - 5.1 (mEq/L)   Chloride 100  96 - 112 (mEq/L)   CO2 23  19 - 32 (mEq/L)   Glucose, Bld 105 (*) 70 - 99 (mg/dL)   BUN 8  6 - 23 (mg/dL)   Creatinine, Ser 7.82  0.50 - 1.10 (mg/dL)   Calcium 8.9  8.4 - 95.6 (mg/dL)   Total Protein 6.8  6.0 - 8.3 (g/dL)   Albumin 3.1 (*) 3.5 - 5.2 (g/dL)   AST 31  0 - 37 (U/L)   ALT 27  0 - 35 (U/L)   Alkaline Phosphatase 73  39 - 117 (U/L)   Total Bilirubin 0.2 (*) 0.3 - 1.2 (mg/dL)   GFR calc non Af Amer >90  >90 (mL/min)   GFR calc Af Amer >90  >90 (mL/min)   A/P: URI: pt likely with influenza. Will tx with Tamiflu and tessalon Perles. She has f/u scheduled with Dr. Charlott Rakes office. Discussed diet, activity, risks, and precautions. Advised pt to avoid intercourse x 1wk. Discussed diet, activity, risks, and precautions.  Clinton Gallant. Lynn Recendiz III, DrHSc, MPAS, PA-C

## 2011-05-25 ENCOUNTER — Inpatient Hospital Stay (HOSPITAL_COMMUNITY): Payer: BC Managed Care – PPO

## 2011-05-25 ENCOUNTER — Inpatient Hospital Stay (HOSPITAL_COMMUNITY)
Admission: AD | Admit: 2011-05-25 | Discharge: 2011-05-26 | Disposition: A | Discharge status: Home / Self Care | Payer: BC Managed Care – PPO | Source: Ambulatory Visit | Attending: Obstetrics and Gynecology | Admitting: Obstetrics and Gynecology

## 2011-05-25 ENCOUNTER — Encounter (HOSPITAL_COMMUNITY): Payer: Self-pay | Admitting: Obstetrics and Gynecology

## 2011-05-25 DIAGNOSIS — O034 Incomplete spontaneous abortion without complication: Secondary | ICD-10-CM

## 2011-05-25 HISTORY — DX: Other specified postprocedural states: Z98.890

## 2011-05-25 HISTORY — DX: Nausea with vomiting, unspecified: R11.2

## 2011-05-25 LAB — CBC
HCT: 36.3 % (ref 36.0–46.0)
Hemoglobin: 12.2 g/dL (ref 12.0–15.0)
MCHC: 33.6 g/dL (ref 30.0–36.0)
RBC: 4.54 MIL/uL (ref 3.87–5.11)

## 2011-05-25 LAB — WET PREP, GENITAL
Clue Cells Wet Prep HPF POC: NONE SEEN
Yeast Wet Prep HPF POC: NONE SEEN

## 2011-05-25 MED ORDER — HYDROCODONE-ACETAMINOPHEN 5-325 MG PO TABS: 1.0000 | ORAL_TABLET | Freq: Four times a day (QID) | ORAL | Status: AC | PRN

## 2011-05-25 NOTE — MAU Note (Signed)
Pt states, " I was here on Wed for spotting, back pain, and fever. I a was dx with flu, and they did an [place] and the baby was ok.  After I left here at 9 am, I started havang heavy bleeding and cramping. I am using a pad every 1.5-2 hrs and still cramping in my low abdomen."

## 2011-05-25 NOTE — MAU Note (Signed)
"  My bleeding is red, heavy and I am filling up a pad every 1-1.5 hours.  This bleeding has gone on since Wednesday at 10am.  I was here on Wednesday morning and then to my doctor's office that afternoon.  I talked to the doctor today and he told me to come here and get another U/S and some bloodwork.  I am still cramping that is bad, but isn't as bad as it has been.  It has been much worse."

## 2011-05-25 NOTE — MAU Provider Note (Signed)
Renee A Love36 y.o.G2P1001 @[redacted]w[redacted]d  Chief Complaint  Patient presents with  . Vaginal Bleeding  . Abdominal Pain    SUBJECTIVE  HPI: Pt presents to MAU with bright red vaginal bleeding with clots, soaking 1-1.5 pads per hour for 2 days.  Heavy bleeding started following pt MAU visit on 05/23/11 for spotting/URI. She did not pick up Tamiflu prescription and reports URI has improved but is still coughing.  She denies fever/chills, n/v, h/a, dizziness, LOF, vaginal itching/burning, or urinary symptoms.   Past Medical History  Diagnosis Date  . No pertinent past medical history   . PONV (postoperative nausea and vomiting)    Past Surgical History  Procedure Date  . Cesarean section   . Hernia repair    History   Social History  . Marital Status: Single    Spouse Name: N/A    Number of Children: N/A  . Years of Education: N/A   Occupational History  . Not on file.   Social History Main Topics  . Smoking status: Never Smoker   . Smokeless tobacco: Not on file  . Alcohol Use: No  . Drug Use: No  . Sexually Active: Yes   Other Topics Concern  . Not on file   Social History Narrative  . No narrative on file   No current facility-administered medications on file prior to encounter.   Current Outpatient Prescriptions on File Prior to Encounter  Medication Sig Dispense Refill  . acetaminophen (TYLENOL) 500 MG tablet Take 1,000 mg by mouth every 6 (six) hours as needed. For pain      . Prenatal Vit-Fe Fumarate-FA (MULTIVITAMIN-PRENATAL) 27-0.8 MG TABS Take 1 tablet by mouth every morning.       . benzonatate (TESSALON) 100 MG capsule Take 1 capsule (100 mg total) by mouth every 8 (eight) hours.  21 capsule  0  . oseltamivir (TAMIFLU) 75 MG capsule Take 1 capsule (75 mg total) by mouth 2 (two) times daily.  10 capsule  0   No Known Allergies  ROS: Pertinent items in HPI  OBJECTIVE Blood pressure 112/68, pulse 105, temperature 97.7 F (36.5 C), temperature source Oral, resp.  rate 18, height 5\' 10"  (1.778 m), weight 128.822 kg (284 lb). GENERAL: Well-developed, well-nourished female in no acute distress.  LUNGS: Clear to auscultation bilaterally.  HEART: Regular rate and rhythm. ABDOMEN: Soft, nontender EXTREMITIES: Nontender, no edema  Pelvic exam: Cervix visually closed, large amount bright red bleeding from cervical os, vagina normal with pooling of blood in posterior fornix Bimanual exam: Cervix 0/long/high, soft, anterior, neg CMT, uterus tender, nonpregnant size, adnexa without tenderness, enlargement, or mass   LAB RESULTS Results for orders placed during the hospital encounter of 05/25/11 (from the past 24 hour(s))  CBC     Status: Normal   Collection Time   05/25/11  9:33 PM      Component Value Range   WBC 7.2  4.0 - 10.5 (K/uL)   RBC 4.54  3.87 - 5.11 (MIL/uL)   Hemoglobin 12.2  12.0 - 15.0 (g/dL)   HCT 16.1  09.6 - 04.5 (%)   MCV 80.0  78.0 - 100.0 (fL)   MCH 26.9  26.0 - 34.0 (pg)   MCHC 33.6  30.0 - 36.0 (g/dL)   RDW 40.9  81.1 - 91.4 (%)   Platelets 203  150 - 400 (K/uL)  HCG, QUANTITATIVE, PREGNANCY     Status: Abnormal   Collection Time   05/25/11  9:33 PM  Component Value Range   hCG, Beta Chain, Quant, S 9870 (*) <5 (mIU/mL)  WET PREP, GENITAL     Status: Abnormal   Collection Time   05/25/11  9:45 PM      Component Value Range   Yeast Wet Prep HPF POC NONE SEEN  NONE SEEN    Trich, Wet Prep NONE SEEN  NONE SEEN    Clue Cells Wet Prep HPF POC NONE SEEN  NONE SEEN    WBC, Wet Prep HPF POC FEW (*) NONE SEEN   ABO/RH     Status: Normal   Collection Time   05/25/11 11:08 PM      Component Value Range   ABO/RH(D) B POS      IMAGING   ASSESSMENT Incomplete SAB   PLAN Called Dr Claiborne Billings to discuss exam/results.  Offered D&C if pt desires.  Pt prefers to go home today. Plan to D/C home with bleeding precautions F/U with Dr Arlyce Dice on Monday Return to MAU as needed

## 2011-05-25 NOTE — Discharge Instructions (Signed)
Incomplete Miscarriage  Miscarriages in pregnancy are common. A miscarriage is a pregnancy that has ended before the twentieth week. You have had an incomplete miscarriage. Partial parts of the fetus or placenta (afterbirth) remain behind. Sometimes further treatment is needed. The most common reason for further treatment is continued bleeding (hemorrhage). Tissue left behind may also become infected. Treatment usually is curettage. Curettage for an incomplete abortion is a procedure in which the remaining products of pregnancy are removed. This can be done by a simple sucking procedure (suction curettage). It can also be done by a simple scraping (curettage) of the inside of the uterus (womb). This may be done in the hospital or in the caregiver's office. This is only done when your caregiver knows the pregnancy has ended. This is determined by physical examination and a negative pregnancy test. It may also include an ultrasound to confirm a dead fetus. The ultrasound may also prove that products of the pregnancy remain in the uterus.  If your cervix remains dilated and you are still passing clots and tissue, your caregiver may wish to watch you for a little while. Your caregiver may want to see if you are going to finish passing all of the remaining parts of the pregnancy. If the bleeding continues, they may proceed with curettage.  WHY DO I FEEL THIS WAY  Miscarriages can be a very emotional time for prospective mothers. This is not you or your partner's fault. The miscarriage did not occur because of a lack in you or your partner. Nearly all miscarriages occur because the pregnancy has started off wrongly. At least half of miscarried pregnancies have a chromosomal abnormality (almost always not inherited). Others may have developmental problems with the fetus or placentas. Problems may not show up even when the products miscarried are studied under the microscope. You can usually begin trying for another  pregnancy as soon as your caregiver says it's okay.  HOME CARE INSTRUCTIONS    Your caregiver may order bed rest (this means only getting up to use the bathroom). Your caregiver may allow you to continue light activity. If curettage was not done at this time, but you require further treatment.   Keep track of the number of pads you use each day. Keep track of how saturated (soaked) they are. Record this information.   Do not use tampons. Do not douche or have sexual intercourse until approved by your caregiver.   It is very important to keep all follow-up appointments for re-evaluation and continuing management.   Women who have an Rh negative blood type (ie, A, B, AB, or O negative) need to receive a drug called Rh(D) immune globulin. This medicine helps protect future fetuses against problems that can occur if an Rh negative mother is carrying a baby who is Rh positive.  SEEK IMMEDIATE MEDICAL CARE IF:    You experience severe cramps in your stomach, back, or abdomen.   You run an unexplained temperature (record these).   You pass large clots or tissue (save any tissue for your caregiver to inspect).   Your bleeding increases or you become light-headed, weak, or have fainting episodes.  MAKE SURE YOU:    Understand these instructions.   Will watch your condition.   Will get help right away if you are not doing well or get worse.  Document Released: 02/26/2005 Document Revised: 02/15/2011 Document Reviewed: 10/17/2007  Exit[employer] Patient Information 2012 [employer].

## 2011-05-26 LAB — ABO/RH: ABO/RH(D): B POS

## 2011-05-27 ENCOUNTER — Inpatient Hospital Stay (HOSPITAL_COMMUNITY)
Admission: AD | Admit: 2011-05-27 | Discharge: 2011-05-27 | Disposition: A | Discharge status: Home / Self Care | Payer: BC Managed Care – PPO | Source: Ambulatory Visit | Attending: Obstetrics and Gynecology | Admitting: Obstetrics and Gynecology

## 2011-05-27 NOTE — ED Notes (Addendum)
PT supposed to f/u with [facility] tomorrow. Was told to come here for f/u [facility] when she was discharged. Re read note form L. Leftwich-Kirby,CNM and discussed with N.Fraisier, CNM  (mid level on duty now) pt is just supposed to got to office. Pt has no new complaints instructed to call MD for further questions or come back to MAU if symptoms worsen. Pt verbalized understanding

## 2012-05-26 ENCOUNTER — Other Ambulatory Visit: Payer: Self-pay | Admitting: Family Medicine

## 2012-05-26 ENCOUNTER — Ambulatory Visit
Admission: RE | Admit: 2012-05-26 | Discharge: 2012-05-26 | Disposition: A | Discharge status: Home / Self Care | Payer: BC Managed Care – PPO | Source: Ambulatory Visit | Attending: Family Medicine | Admitting: Family Medicine

## 2012-05-26 DIAGNOSIS — R609 Edema, unspecified: Secondary | ICD-10-CM

## 2012-06-10 ENCOUNTER — Encounter (HOSPITAL_COMMUNITY): Payer: Self-pay | Admitting: Emergency Medicine

## 2012-06-10 ENCOUNTER — Emergency Department (HOSPITAL_COMMUNITY): Payer: BC Managed Care – PPO

## 2012-06-10 ENCOUNTER — Emergency Department (HOSPITAL_COMMUNITY)
Admission: EM | Admit: 2012-06-10 | Discharge: 2012-06-10 | Disposition: A | Discharge status: Home / Self Care | Payer: BC Managed Care – PPO | Attending: Emergency Medicine | Admitting: Emergency Medicine

## 2012-06-10 DIAGNOSIS — R000 Tachycardia, unspecified: Secondary | ICD-10-CM | POA: Insufficient documentation

## 2012-06-10 DIAGNOSIS — R42 Dizziness and giddiness: Secondary | ICD-10-CM | POA: Insufficient documentation

## 2012-06-10 DIAGNOSIS — Z3202 Encounter for pregnancy test, result negative: Secondary | ICD-10-CM | POA: Insufficient documentation

## 2012-06-10 DIAGNOSIS — N949 Unspecified condition associated with female genital organs and menstrual cycle: Secondary | ICD-10-CM | POA: Insufficient documentation

## 2012-06-10 DIAGNOSIS — Z792 Long term (current) use of antibiotics: Secondary | ICD-10-CM | POA: Insufficient documentation

## 2012-06-10 DIAGNOSIS — E669 Obesity, unspecified: Secondary | ICD-10-CM | POA: Insufficient documentation

## 2012-06-10 DIAGNOSIS — N898 Other specified noninflammatory disorders of vagina: Secondary | ICD-10-CM | POA: Insufficient documentation

## 2012-06-10 DIAGNOSIS — R109 Unspecified abdominal pain: Secondary | ICD-10-CM | POA: Insufficient documentation

## 2012-06-10 DIAGNOSIS — R5381 Other malaise: Secondary | ICD-10-CM | POA: Insufficient documentation

## 2012-06-10 DIAGNOSIS — N939 Abnormal uterine and vaginal bleeding, unspecified: Secondary | ICD-10-CM

## 2012-06-10 DIAGNOSIS — R9389 Abnormal findings on diagnostic imaging of other specified body structures: Secondary | ICD-10-CM

## 2012-06-10 LAB — POCT I-STAT, CHEM 8
BUN: 11 mg/dL (ref 6–23)
Calcium, Ion: 1.16 mmol/L (ref 1.12–1.23)
Chloride: 106 mEq/L (ref 96–112)
Creatinine, Ser: 0.8 mg/dL (ref 0.50–1.10)
Glucose, Bld: 93 mg/dL (ref 70–99)
TCO2: 24 mmol/L (ref 0–100)

## 2012-06-10 LAB — WET PREP, GENITAL: Clue Cells Wet Prep HPF POC: NONE SEEN

## 2012-06-10 LAB — CBC
Hemoglobin: 12.5 g/dL (ref 12.0–15.0)
MCH: 26.3 pg (ref 26.0–34.0)
MCHC: 34.2 g/dL (ref 30.0–36.0)
Platelets: 340 10*3/uL (ref 150–400)
RDW: 13.6 % (ref 11.5–15.5)

## 2012-06-10 LAB — URINALYSIS, ROUTINE W REFLEX MICROSCOPIC
Bilirubin Urine: NEGATIVE
Glucose, UA: NEGATIVE mg/dL
Hgb urine dipstick: NEGATIVE
Specific Gravity, Urine: 1.023 (ref 1.005–1.030)
pH: 5.5 (ref 5.0–8.0)

## 2012-06-10 LAB — POCT PREGNANCY, URINE: Preg Test, Ur: NEGATIVE

## 2012-06-10 MED ORDER — FERROUS SULFATE 325 (65 FE) MG PO TABS: 325.0000 mg | ORAL_TABLET | Freq: Every day | ORAL | Status: DC

## 2012-06-10 MED ORDER — IBUPROFEN 800 MG PO TABS
800.0000 mg | ORAL_TABLET | Freq: Once | ORAL | Status: AC
  Administered 2012-06-10: 800 mg via ORAL
  Filled 2012-06-10: qty 1

## 2012-06-10 MED ORDER — SODIUM CHLORIDE 0.9 % IV BOLUS (SEPSIS)
1000.0000 mL | Freq: Once | INTRAVENOUS | Status: AC
  Administered 2012-06-10: 1000 mL via INTRAVENOUS

## 2012-06-10 NOTE — ED Notes (Signed)
Ultrasound ongoing at bedside at this time.

## 2012-06-10 NOTE — ED Provider Notes (Signed)
History     CSN: [id number]  Arrival date & time 06/10/12  4098   First MD Initiated Contact with Patient 06/10/12 1844      Chief Complaint  Patient presents with  . Vaginal Bleeding    (Consider location/radiation/quality/duration/timing/severity/associated sxs/prior treatment) HPI Comments: 37 year old female presents to the emergency department complaining of vaginal bleeding x3 weeks. Patient states her last menstrual period began 3 weeks ago which was a week and a half week from when she was supposed to be due. She did take a home pregnancy test a few days before onset of bleeding which was negative. Over the past couple days the bleeding has become heavier with thicker clots. She called her OB/GYN Dr. Lilyan Punt office yesterday who told her that there is not much they could do at that time but will see her in the office on April 9. She's been having intermittent lower abdominal cramping described as a dull ache, however today while at work she developed a severe non-radiating left-sided pain that lasted most of the work day, subsided without any intervention. At that time she became lightheaded and felt weak. States she's been taking iron supplementation since she has been bleeding. One week ago her PCP diagnosed her with lupus. She and her husband are trying to pregnant. Denies fever, chills, nausea or vomiting.  Patient is a 37 y.o. female presenting with vaginal bleeding. The history is provided by the patient.  Vaginal Bleeding Associated symptoms include abdominal pain and weakness. Pertinent negatives include no chills, fever, nausea or vomiting.    Past Medical History  Diagnosis Date  . No pertinent past medical history   . PONV (postoperative nausea and vomiting)     Past Surgical History  Procedure Laterality Date  . Cesarean section    . Hernia repair      No family history on file.  History  Substance Use Topics  . Smoking status: Never Smoker   . Smokeless  tobacco: Not on file  . Alcohol Use: No    OB History   Grav Para Term Preterm Abortions TAB SAB Ect Mult Living   2 1 1       1       Review of Systems  Constitutional: Negative for fever and chills.  Gastrointestinal: Positive for abdominal pain. Negative for nausea and vomiting.  Genitourinary: Positive for vaginal bleeding and pelvic pain.  Musculoskeletal: Negative for back pain.  Neurological: Positive for weakness and light-headedness. Negative for syncope.  All other systems reviewed and are negative.    Allergies  Review of patient's allergies indicates no known allergies.  Home Medications   Current Outpatient Rx  Name  Route  Sig  Dispense  Refill  . acetaminophen (TYLENOL) 500 MG tablet   Oral   Take 1,000 mg by mouth every 6 (six) hours as needed. For pain         . ciprofloxacin (CIPRO) 500 MG tablet   Oral   Take 500 mg by mouth 2 (two) times daily.           BP 128/86  Pulse 101  Temp(Src) 98.2 F (36.8 C)  Resp 18  SpO2 100%  LMP 05/20/2012  Breastfeeding? Unknown  Physical Exam  Nursing note and vitals reviewed. Constitutional: She is oriented to person, place, and time. She appears well-developed. No distress.  Obese  HENT:  Head: Normocephalic and atraumatic.  Mouth/Throat: Oropharynx is clear and moist and mucous membranes are normal. Mucous membranes are not pale.  Eyes: Conjunctivae are normal.  Neck: Normal range of motion. Neck supple.  Cardiovascular: Regular rhythm, normal heart sounds and intact distal pulses.  Tachycardia present.   Pulmonary/Chest: Effort normal and breath sounds normal. No respiratory distress.  Abdominal: Soft. Normal appearance and bowel sounds are normal. She exhibits no distension and no mass. There is tenderness in the suprapubic area and left lower quadrant. There is guarding. There is no rigidity, no rebound and no CVA tenderness.  Genitourinary: Uterus is not tender. Cervix exhibits discharge  (bloody). Cervix exhibits no motion tenderness. Right adnexum displays no mass, no tenderness and no fullness. Left adnexum displays tenderness. Left adnexum displays no mass and no fullness. There is bleeding (thick dark red clots) around the vagina. No tenderness around the vagina.  Bimanual exam limited by patient's body habitus.  Musculoskeletal: Normal range of motion. She exhibits no edema.  Neurological: She is alert and oriented to person, place, and time.  Skin: Skin is warm and dry. No pallor.  Psychiatric: She has a normal mood and affect. Her behavior is normal.    ED Course  Procedures (including critical care time)  Labs Reviewed  GC/CHLAMYDIA PROBE AMP  WET PREP, GENITAL  CBC  URINALYSIS, ROUTINE W REFLEX MICROSCOPIC   US Transvaginal Non-ob  06/10/2012  *RADIOLOGY REPORT*  Clinical Data: Vaginal bleeding  TRANSABDOMINAL AND TRANSVAGINAL ULTRASOUND OF PELVIS Technique:  Both transabdominal and transvaginal ultrasound examinations of the pelvis were performed. Transabdominal technique was performed for global imaging of the pelvis including uterus, ovaries, adnexal regions, and pelvic cul-de-sac.  It was necessary to proceed with endovaginal exam following the transabdominal exam to visualize the endometrium.  Comparison:  09/19/2006  Findings:  Uterus: 11.0 x 4.5 x 7.0 cm.  No myometrial mass.  Endometrium: 16 mm in thickness.  The fundal endometrium is somewhat heterogeneous.  Right ovary:  4.4 x 2.5 x 3.1 cm.  2.7 cm minimally complex cyst is noted.  It has a benign appearance.  Left ovary: Within normal limits.  Other findings: Trace free fluid.  IMPRESSION: The endometrium is borderline thickened and heterogeneous. Consider endometrial biopsy.   Original Report Authenticated By: Jolaine Click, M.D.    US Pelvis Complete  06/10/2012  *RADIOLOGY REPORT*  Clinical Data: Vaginal bleeding  TRANSABDOMINAL AND TRANSVAGINAL ULTRASOUND OF PELVIS Technique:  Both transabdominal and  transvaginal ultrasound examinations of the pelvis were performed. Transabdominal technique was performed for global imaging of the pelvis including uterus, ovaries, adnexal regions, and pelvic cul-de-sac.  It was necessary to proceed with endovaginal exam following the transabdominal exam to visualize the endometrium.  Comparison:  09/19/2006  Findings:  Uterus: 11.0 x 4.5 x 7.0 cm.  No myometrial mass.  Endometrium: 16 mm in thickness.  The fundal endometrium is somewhat heterogeneous.  Right ovary:  4.4 x 2.5 x 3.1 cm.  2.7 cm minimally complex cyst is noted.  It has a benign appearance.  Left ovary: Within normal limits.  Other findings: Trace free fluid.  IMPRESSION: The endometrium is borderline thickened and heterogeneous. Consider endometrial biopsy.   Original Report Authenticated By: Jolaine Click, M.D.      1. Vaginal bleeding   2. Endometrial thickening on ultra sound       MDM  37 y/o female with vaginal bleeding and abdominal pain worse on left. Large amount of blood present on pelvic exam. Tenderness in L adnexa. No CMT. Bimanual exam limited by patient's body habitus. Obtaining pelvic US. Ibuprofen given for cramping and patient comfortable at  this time. Vitals stable.  10:43 PM US showing endometrium thickened and heterogeneous. Labs unremarkable- no anemia. Hemodynamically stable. She has f/u with her gyn on April 9. I advised her to call office to try to get in earlier if possible. I will discharge her with iron pills and advised her to stay well hydrated. Close return precautions discussed. Patient states understanding of plan and is agreeable.      Trevor Mace, PA-C 06/10/12 2245

## 2012-06-10 NOTE — ED Notes (Signed)
Per patient, states she has been on period for 3 weeks now, started passing clots-started period week and a half late, took pregnancy test and was negative-today felt dizzy and having lower abdominal pain

## 2012-06-11 LAB — GC/CHLAMYDIA PROBE AMP
CT Probe RNA: NEGATIVE
GC Probe RNA: NEGATIVE

## 2012-06-11 NOTE — ED Provider Notes (Signed)
Medical screening examination/treatment/procedure(s) were performed by non-physician practitioner and as supervising physician I was immediately available for consultation/collaboration.  Qasim Diveley T Chelsia Serres, MD 06/11/12 1708 

## 2013-02-08 IMAGING — US US OB TRANSVAGINAL
1 series · 13 of 28 positions shown · non-contrast
Comparison: none

[Series 1: us ob transvaginal · 33 acquisitions, 13 frames shown]
[im 2/33]
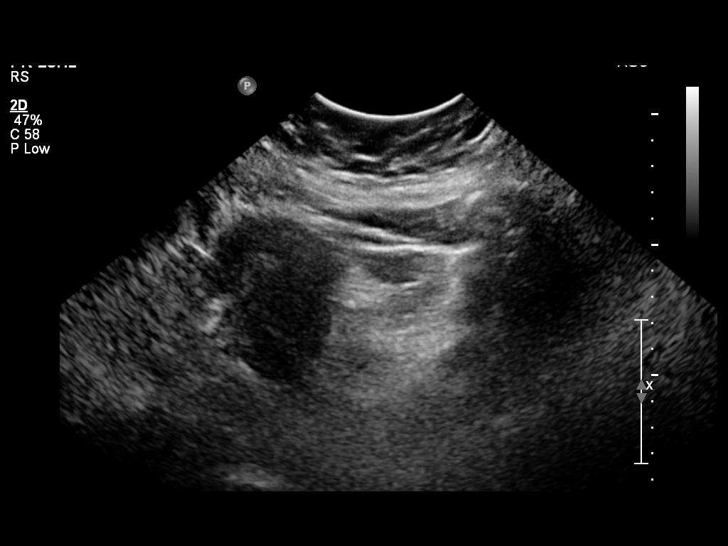
[im 4/33]
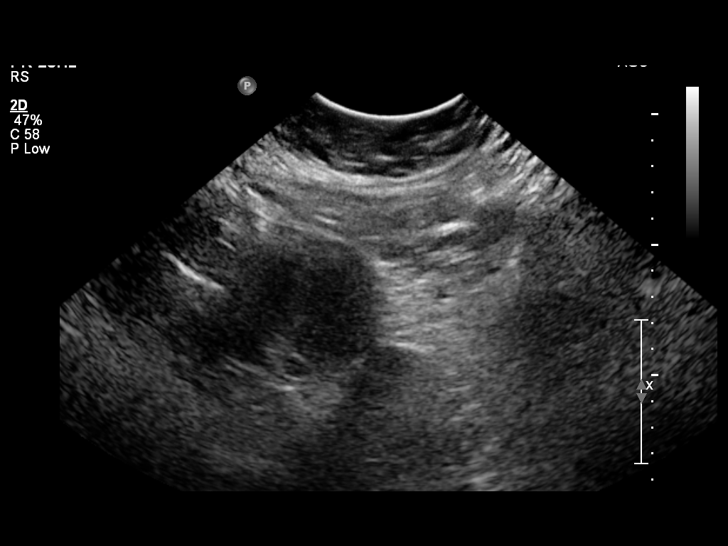
[im 6/33]
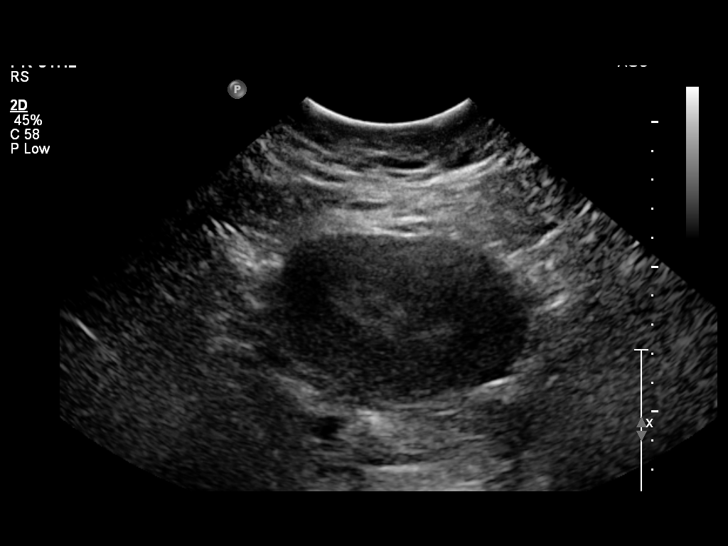
[im 9/33]
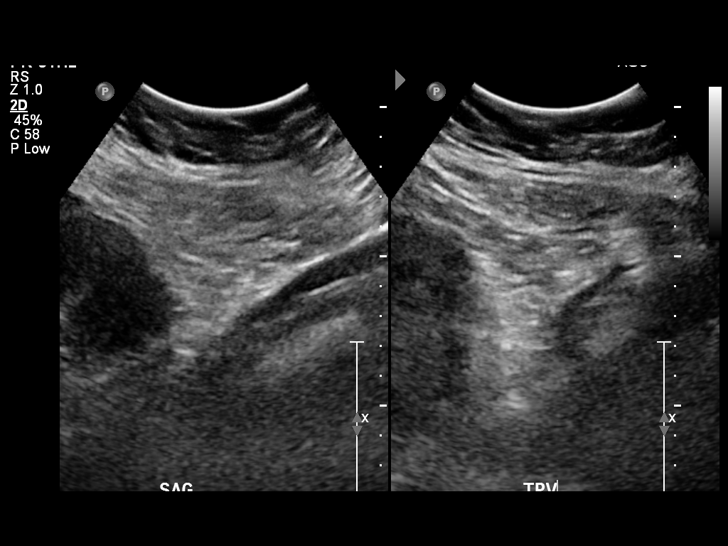
[im 11/33]
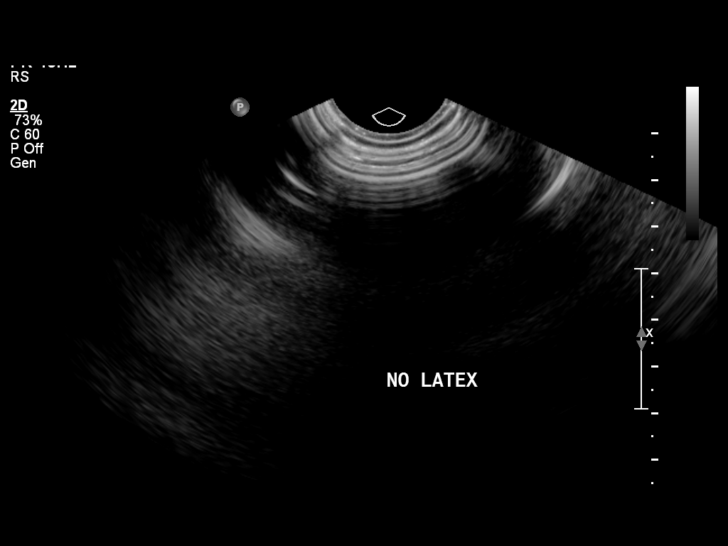
[im 14/33]
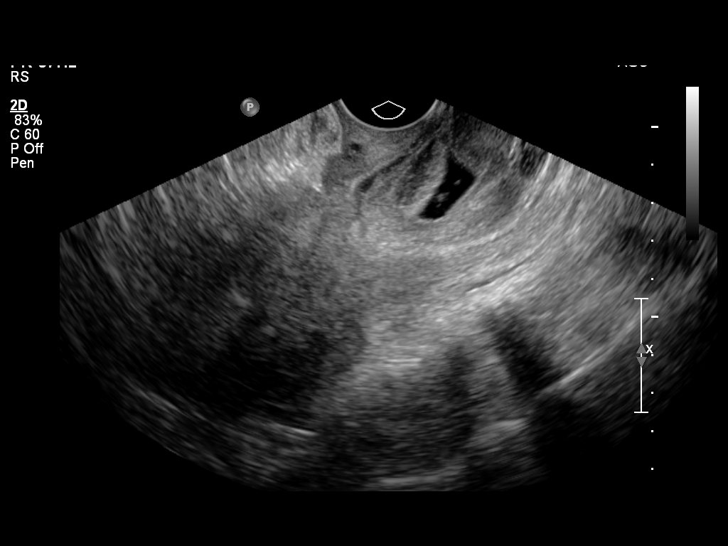
[im 17/33]
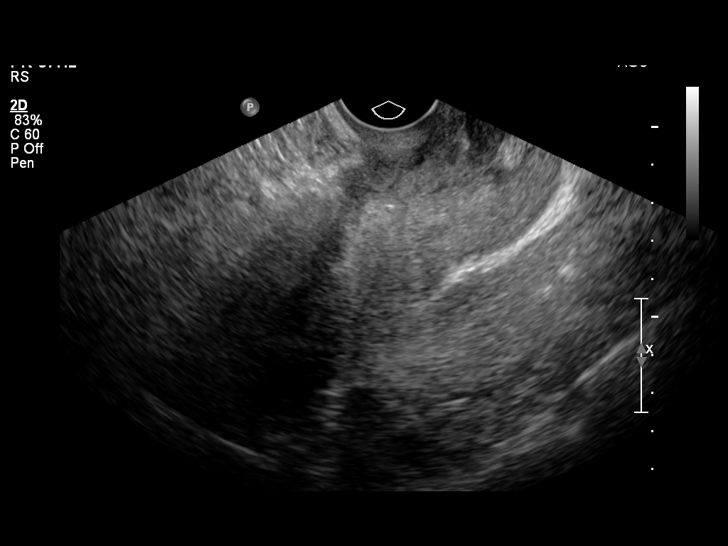
[im 19/33]
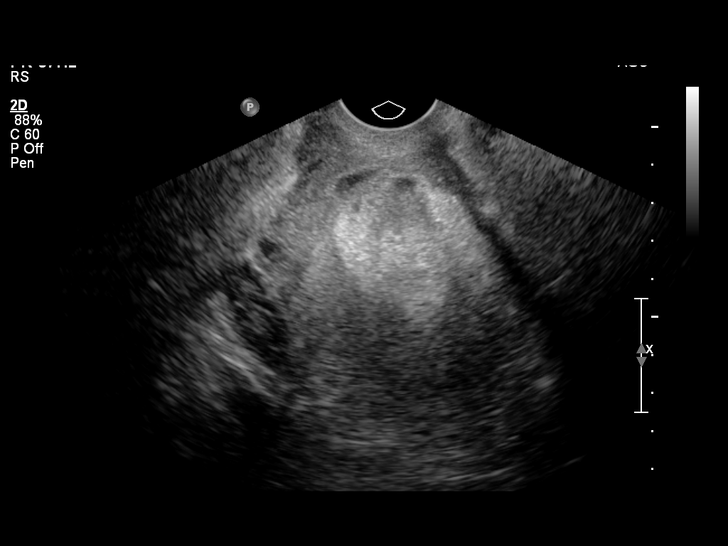
[im 22/33]
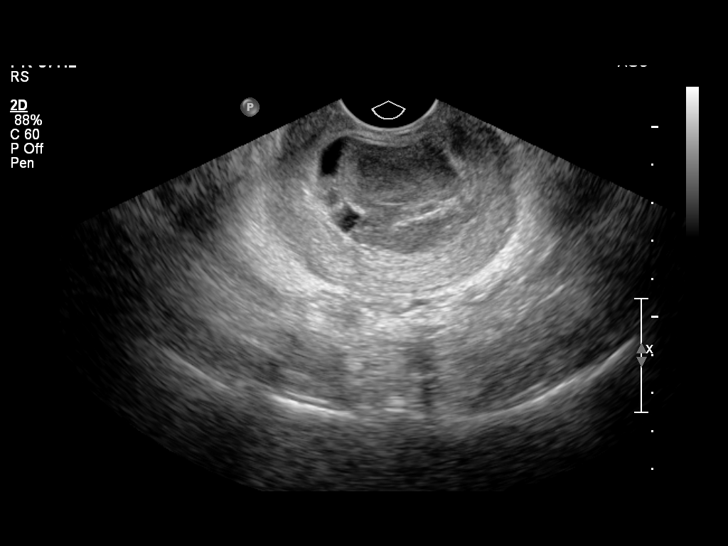
[im 24/33]
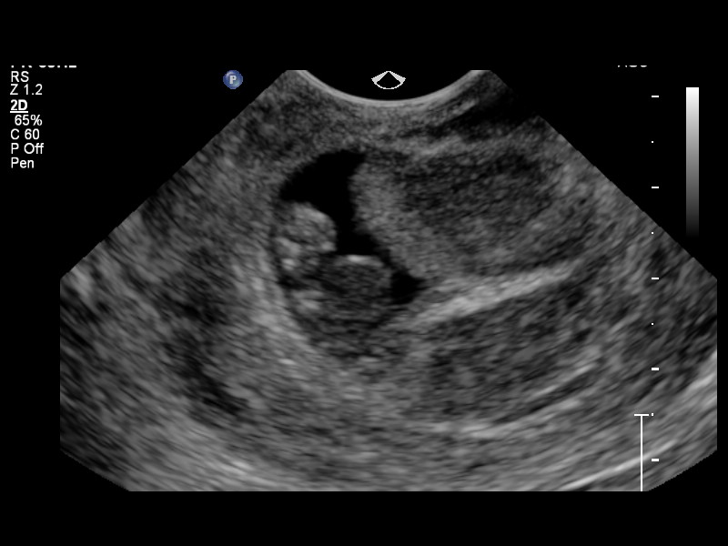
[im 27/33]
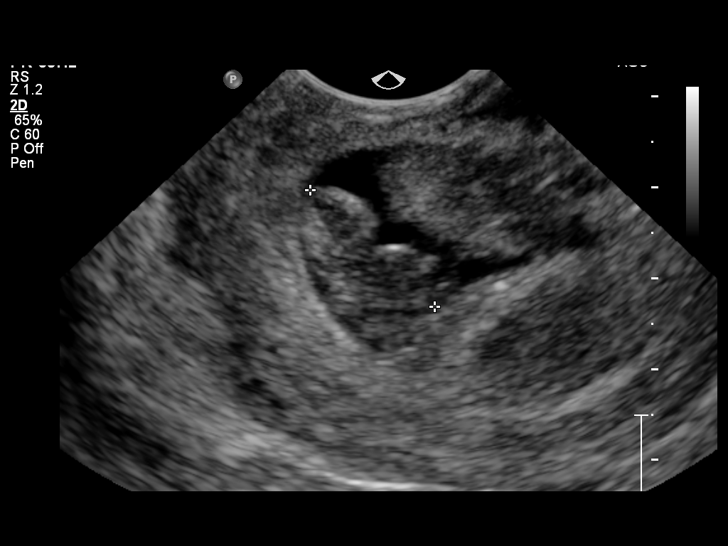
[im 29/33]
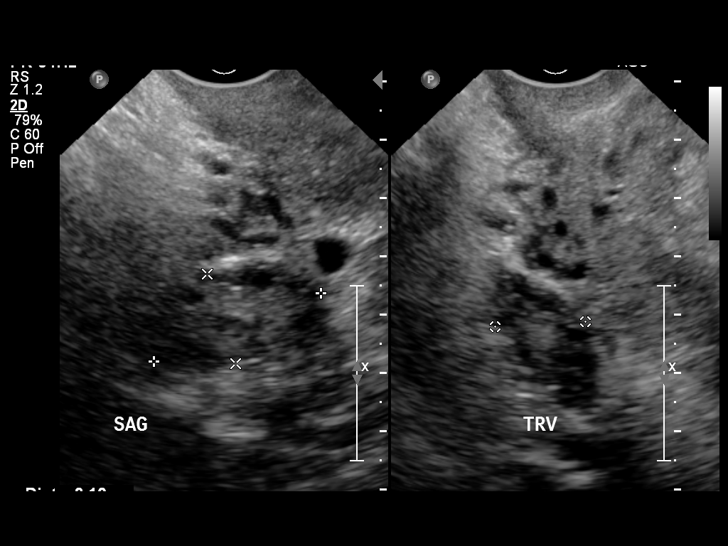
[im 31/33]
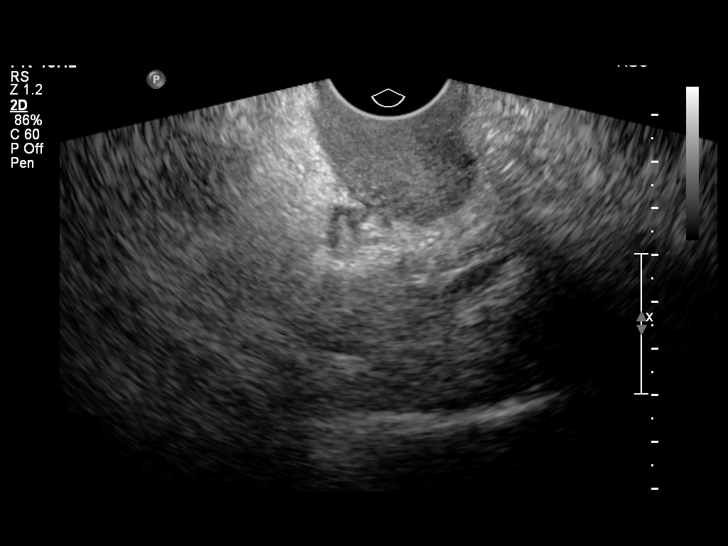

[13 of 28 positions shown; findings below may reference images not displayed]

OBSTETRICS REPORT
                      (Signed Final 05/25/2011 [DATE])

 Name:    ARLID SIJONA                   Visit Date: 05/25/2011 [DATE]

                 CNM
                 95_E
Procedures

 US OB TRANSVAGINAL                                    76817.0
 US OB COMP LESS 14 WKS                                76801.0
Indications

 Vaginal bleeding, unknown etiology
Fetal Evaluation

 Preg. Location:    Intrauterine
 Gest. Sac:         Lower segment / cervix
 Yolk Sac:          Not visualized
 Fetal Pole:        Visualized
 Cardiac Activity:  Absent
Gestational Age

 Clinical EDD:  8w 6d                                         EDD:   12/29/11
 Best:          8w 6d     Det. By:   Clinical EDD             EDD:   12/29/11
Cervix Uterus Adnexa

 Cervix:       See comments below.

 Left Ovary:   Not visualized.
 Right Ovary:  Normal, measuring 3.1 x 1.6 x 1.6cm.
 Adnexa:     No abnormality visualized.
 Comment:    Fetus and products of conception in cervical area.
Impression

 Fetal demise and spontaneous abortion in progress. The
 fetus and products of conception are within the dilated
 cervical canal.

 Thank you for sharing in the care of Ms. ARLID SIJONA with
 questions or concerns.

## 2013-10-06 ENCOUNTER — Emergency Department (HOSPITAL_BASED_OUTPATIENT_CLINIC_OR_DEPARTMENT_OTHER)
Admission: EM | Admit: 2013-10-06 | Discharge: 2013-10-06 | Disposition: A | Discharge status: Home / Self Care | Payer: BC Managed Care – PPO | Attending: Emergency Medicine | Admitting: Emergency Medicine

## 2013-10-06 ENCOUNTER — Emergency Department (HOSPITAL_BASED_OUTPATIENT_CLINIC_OR_DEPARTMENT_OTHER): Payer: BC Managed Care – PPO

## 2013-10-06 ENCOUNTER — Encounter (HOSPITAL_BASED_OUTPATIENT_CLINIC_OR_DEPARTMENT_OTHER): Payer: Self-pay | Admitting: Emergency Medicine

## 2013-10-06 DIAGNOSIS — Z3202 Encounter for pregnancy test, result negative: Secondary | ICD-10-CM | POA: Insufficient documentation

## 2013-10-06 DIAGNOSIS — Z79899 Other long term (current) drug therapy: Secondary | ICD-10-CM | POA: Insufficient documentation

## 2013-10-06 DIAGNOSIS — R1011 Right upper quadrant pain: Secondary | ICD-10-CM | POA: Insufficient documentation

## 2013-10-06 DIAGNOSIS — R197 Diarrhea, unspecified: Secondary | ICD-10-CM

## 2013-10-06 DIAGNOSIS — R112 Nausea with vomiting, unspecified: Secondary | ICD-10-CM

## 2013-10-06 DIAGNOSIS — R1013 Epigastric pain: Secondary | ICD-10-CM | POA: Insufficient documentation

## 2013-10-06 DIAGNOSIS — R1110 Vomiting, unspecified: Secondary | ICD-10-CM | POA: Insufficient documentation

## 2013-10-06 DIAGNOSIS — M549 Dorsalgia, unspecified: Secondary | ICD-10-CM | POA: Insufficient documentation

## 2013-10-06 DIAGNOSIS — R109 Unspecified abdominal pain: Secondary | ICD-10-CM

## 2013-10-06 LAB — COMPREHENSIVE METABOLIC PANEL
ALK PHOS: 70 U/L (ref 39–117)
ALT: 20 U/L (ref 0–35)
ANION GAP: 14 (ref 5–15)
AST: 21 U/L (ref 0–37)
Albumin: 3.9 g/dL (ref 3.5–5.2)
BUN: 11 mg/dL (ref 6–23)
CO2: 25 meq/L (ref 19–32)
Calcium: 9.7 mg/dL (ref 8.4–10.5)
Chloride: 101 mEq/L (ref 96–112)
Creatinine, Ser: 1 mg/dL (ref 0.50–1.10)
GFR, EST AFRICAN AMERICAN: 82 mL/min — AB (ref >90)
GFR, EST NON AFRICAN AMERICAN: 71 mL/min — AB (ref >90)
GLUCOSE: 97 mg/dL (ref 70–99)
POTASSIUM: 3.9 meq/L (ref 3.7–5.3)
SODIUM: 140 meq/L (ref 137–147)
TOTAL PROTEIN: 7.5 g/dL (ref 6.0–8.3)
Total Bilirubin: 0.5 mg/dL (ref 0.3–1.2)

## 2013-10-06 LAB — CBC WITH DIFFERENTIAL/PLATELET
Basophils Absolute: 0 10*3/uL (ref 0.0–0.1)
Basophils Relative: 0 % (ref 0–1)
EOS ABS: 0.1 10*3/uL (ref 0.0–0.7)
EOS PCT: 2 % (ref 0–5)
HEMATOCRIT: 36.6 % (ref 36.0–46.0)
Hemoglobin: 12.5 g/dL (ref 12.0–15.0)
LYMPHS ABS: 2.1 10*3/uL (ref 0.7–4.0)
LYMPHS PCT: 22 % (ref 12–46)
MCH: 27 pg (ref 26.0–34.0)
MCHC: 34.2 g/dL (ref 30.0–36.0)
MCV: 79 fL (ref 78.0–100.0)
MONOS PCT: 6 % (ref 3–12)
Monocytes Absolute: 0.6 10*3/uL (ref 0.1–1.0)
Neutro Abs: 6.6 10*3/uL (ref 1.7–7.7)
Neutrophils Relative %: 70 % (ref 43–77)
Platelets: 338 10*3/uL (ref 150–400)
RBC: 4.63 MIL/uL (ref 3.87–5.11)
RDW: 13 % (ref 11.5–15.5)
WBC: 9.5 10*3/uL (ref 4.0–10.5)

## 2013-10-06 LAB — URINALYSIS, ROUTINE W REFLEX MICROSCOPIC
Bilirubin Urine: NEGATIVE
Glucose, UA: NEGATIVE mg/dL
Hgb urine dipstick: NEGATIVE
Ketones, ur: NEGATIVE mg/dL
NITRITE: NEGATIVE
PROTEIN: NEGATIVE mg/dL
Specific Gravity, Urine: 1.022 (ref 1.005–1.030)
UROBILINOGEN UA: 0.2 mg/dL (ref 0.0–1.0)
pH: 5.5 (ref 5.0–8.0)

## 2013-10-06 LAB — URINE MICROSCOPIC-ADD ON

## 2013-10-06 LAB — PREGNANCY, URINE: PREG TEST UR: NEGATIVE

## 2013-10-06 LAB — LIPASE, BLOOD: Lipase: 35 U/L (ref 11–59)

## 2013-10-06 MED ORDER — ONDANSETRON HCL 4 MG/2ML IJ SOLN
4.0000 mg | Freq: Once | INTRAMUSCULAR | Status: AC
  Administered 2013-10-06: 4 mg via INTRAVENOUS

## 2013-10-06 MED ORDER — OXYCODONE-ACETAMINOPHEN 5-325 MG PO TABS: 1.0000 | ORAL_TABLET | Freq: Four times a day (QID) | ORAL | Status: DC | PRN

## 2013-10-06 MED ORDER — ONDANSETRON HCL 4 MG/2ML IJ SOLN
INTRAMUSCULAR | Status: AC
  Filled 2013-10-06: qty 2

## 2013-10-06 MED ORDER — SODIUM CHLORIDE 0.9 % IV BOLUS (SEPSIS)
1000.0000 mL | Freq: Once | INTRAVENOUS | Status: AC
  Administered 2013-10-06: 1000 mL via INTRAVENOUS

## 2013-10-06 MED ORDER — ONDANSETRON 4 MG PO TBDP: ORAL_TABLET | ORAL | Status: DC

## 2013-10-06 MED ORDER — PANTOPRAZOLE SODIUM 20 MG PO TBEC: 20.0000 mg | DELAYED_RELEASE_TABLET | Freq: Every day | ORAL | Status: DC

## 2013-10-06 MED ORDER — ACETAMINOPHEN 325 MG PO TABS
650.0000 mg | ORAL_TABLET | Freq: Once | ORAL | Status: AC
  Administered 2013-10-06: 650 mg via ORAL
  Filled 2013-10-06: qty 2

## 2013-10-06 NOTE — Discharge Instructions (Signed)
Abdominal Pain, Women °Abdominal (stomach, pelvic, or belly) pain can be caused by many things. It is important to tell your doctor: °· The location of the pain. °· Does it come and go or is it present all the time? °· Are there things that start the pain (eating certain foods, exercise)? °· Are there other symptoms associated with the pain (fever, nausea, vomiting, diarrhea)? °All of this is helpful to know when trying to find the cause of the pain. °CAUSES  °· Stomach: virus or bacteria infection, or ulcer. °· Intestine: appendicitis (inflamed appendix), regional ileitis (Crohn's disease), ulcerative colitis (inflamed colon), irritable bowel syndrome, diverticulitis (inflamed diverticulum of the colon), or cancer of the stomach or intestine. °· Gallbladder disease or stones in the gallbladder. °· Kidney disease, kidney stones, or infection. °· Pancreas infection or cancer. °· Fibromyalgia (pain disorder). °· Diseases of the female organs: °¨ Uterus: fibroid (non-cancerous) tumors or infection. °¨ Fallopian tubes: infection or tubal pregnancy. °¨ Ovary: cysts or tumors. °¨ Pelvic adhesions (scar tissue). °¨ Endometriosis (uterus lining tissue growing in the pelvis and on the pelvic organs). °¨ Pelvic congestion syndrome (female organs filling up with blood just before the menstrual period). °¨ Pain with the menstrual period. °¨ Pain with ovulation (producing an egg). °¨ Pain with an IUD (intrauterine device, birth control) in the uterus. °¨ Cancer of the female organs. °· Functional pain (pain not caused by a disease, may improve without treatment). °· Psychological pain. °· Depression. °DIAGNOSIS  °Your doctor will decide the seriousness of your pain by doing an examination. °· Blood tests. °· X-rays. °· Ultrasound. °· CT scan (computed tomography, special type of X-ray). °· MRI (magnetic resonance imaging). °· Cultures, for infection. °· Barium enema (dye inserted in the large intestine, to better view it with  X-rays). °· Colonoscopy (looking in intestine with a lighted tube). °· Laparoscopy (minor surgery, looking in abdomen with a lighted tube). °· Major abdominal exploratory surgery (looking in abdomen with a large incision). °TREATMENT  °The treatment will depend on the cause of the pain.  °· Many cases can be observed and treated at home. °· Over-the-counter medicines recommended by your caregiver. °· Prescription medicine. °· Antibiotics, for infection. °· Birth control pills, for painful periods or for ovulation pain. °· Hormone treatment, for endometriosis. °· Nerve blocking injections. °· Physical therapy. °· Antidepressants. °· Counseling with a psychologist or psychiatrist. °· Minor or major surgery. °HOME CARE INSTRUCTIONS  °· Do not take laxatives, unless directed by your caregiver. °· Take over-the-counter pain medicine only if ordered by your caregiver. Do not take aspirin because it can cause an upset stomach or bleeding. °· Try a clear liquid diet (broth or water) as ordered by your caregiver. Slowly move to a bland diet, as tolerated, if the pain is related to the stomach or intestine. °· Have a thermometer and take your temperature several times a day, and record it. °· Bed rest and sleep, if it helps the pain. °· Avoid sexual intercourse, if it causes pain. °· Avoid stressful situations. °· Keep your follow-up appointments and tests, as your caregiver orders. °· If the pain does not go away with medicine or surgery, you may try: °¨ Acupuncture. °¨ Relaxation exercises (yoga, meditation). °¨ Group therapy. °¨ Counseling. °SEEK MEDICAL CARE IF:  °· You notice certain foods cause stomach pain. °· Your home care treatment is not helping your pain. °· You need stronger pain medicine. °· You want your IUD removed. °· You feel faint or   lightheaded. °· You develop nausea and vomiting. °· You develop a rash. °· You are having side effects or an allergy to your medicine. °SEEK IMMEDIATE MEDICAL CARE IF:  °· Your  pain does not go away or gets worse. °· You have a fever. °· Your pain is felt only in portions of the abdomen. The right side could possibly be appendicitis. The left lower portion of the abdomen could be colitis or diverticulitis. °· You are passing blood in your stools (bright red or black tarry stools, with or without vomiting). °· You have blood in your urine. °· You develop chills, with or without a fever. °· You pass out. °MAKE SURE YOU:  °· Understand these instructions. °· Will watch your condition. °· Will get help right away if you are not doing well or get worse. °Document Released: 12/24/2006 Document Revised: 07/13/2013 Document Reviewed: 01/13/2009 °[employer]® Patient Information ©2015 [employer]. This information is not intended to replace advice given to you by your health care provider. Make sure you discuss any questions you have with your health care provider. ° °

## 2013-10-06 NOTE — ED Notes (Signed)
Pt c/o n/v/d  x 6 days  

## 2013-10-06 NOTE — ED Provider Notes (Signed)
CSN: [id number]     Arrival date & time 10/06/13  1713 History   First MD Initiated Contact with Patient 10/06/13 1748     Chief Complaint  Patient presents with  . Emesis     (Consider location/radiation/quality/duration/timing/severity/associated sxs/prior Treatment) Patient is a 38 y.o. female presenting with vomiting.  Emesis Severity:  Mild Duration:  6 days Timing:  Intermittent Quality:  Stomach contents Able to tolerate:  Solids and liquids Chronicity:  New Relieved by:  Nothing Worsened by:  Nothing tried Ineffective treatments:  None tried Associated symptoms: abdominal pain and diarrhea   Associated symptoms: no headaches   Abdominal pain:    Pain location: upper abdomen.   Quality:  Aching   Severity:  Mild   Onset quality:  Gradual   Duration:  6 days   Timing:  Constant   Progression:  Unchanged   Chronicity:  New   Past Medical History  Diagnosis Date  . No pertinent past medical history   . PONV (postoperative nausea and vomiting)    Past Surgical History  Procedure Laterality Date  . Cesarean section    . Hernia repair     History reviewed. No pertinent family history. History  Substance Use Topics  . Smoking status: Never Smoker   . Smokeless tobacco: Not on file  . Alcohol Use: No   OB History   Grav Para Term Preterm Abortions TAB SAB Ect Mult Living   2 1 1       1      Review of Systems  Constitutional: Negative for fever and fatigue.  HENT: Negative for congestion and drooling.   Eyes: Negative for pain.  Respiratory: Negative for cough and shortness of breath.   Cardiovascular: Negative for chest pain.  Gastrointestinal: Positive for nausea, vomiting, abdominal pain and diarrhea.  Genitourinary: Negative for dysuria and hematuria.  Musculoskeletal: Positive for back pain. Negative for gait problem and neck pain.  Skin: Negative for color change.  Neurological: Negative for dizziness and headaches.  Hematological: Negative for  adenopathy.  Psychiatric/Behavioral: Negative for behavioral problems.  All other systems reviewed and are negative.     Allergies  Review of patient's allergies indicates no known allergies.  Home Medications   Prior to Admission medications   Medication Sig Start Date End Date Taking? Authorizing Provider  hydroxychloroquine (PLAQUENIL) 200 MG tablet Take by mouth 2 (two) times daily.   Yes Historical Provider, MD  acetaminophen (TYLENOL) 500 MG tablet Take 1,000 mg by mouth every 6 (six) hours as needed. For pain    Historical Provider, MD  ferrous sulfate 325 (65 FE) MG tablet Take 1 tablet (325 mg total) by mouth daily. 06/10/12   08/10/12, PA-C   BP 128/75  Pulse 96  Temp(Src) 98.9 F (37.2 C) (Oral)  Resp 18  Ht 5\' 10"  (1.778 m)  Wt 314 lb (142.429 kg)  BMI 45.05 kg/m2  SpO2 99%  LMP 09/29/2013 Physical Exam  Nursing note and vitals reviewed. Constitutional: She is oriented to person, place, and time. She appears well-developed and well-nourished.  HENT:  Head: Normocephalic and atraumatic.  Mouth/Throat: Oropharynx is clear and moist. No oropharyngeal exudate.  Eyes: Conjunctivae and EOM are normal. Pupils are equal, round, and reactive to light.  Neck: Normal range of motion. Neck supple.  Cardiovascular: Normal rate, regular rhythm, normal heart sounds and intact distal pulses.  Exam reveals no gallop and no friction rub.   No murmur heard. Pulmonary/Chest: Effort normal and breath  sounds normal. No respiratory distress. She has no wheezes.  Abdominal: Soft. Bowel sounds are normal. There is tenderness (mild tenderness to palpation of the right upper quadrant and epigastric area.). There is no rebound and no guarding.  Musculoskeletal: Normal range of motion. She exhibits no edema and no tenderness.  No CVA tenderness bilaterally. No focal tenderness to palpation of the back.   Neurological: She is alert and oriented to person, place, and time.  Skin: Skin is  warm and dry.  Psychiatric: She has a normal mood and affect. Her behavior is normal.    ED Course  Procedures (including critical care time) Labs Review Labs Reviewed  URINALYSIS, ROUTINE W REFLEX MICROSCOPIC - Abnormal; Notable for the following:    Leukocytes, UA SMALL (*)    All other components within normal limits  COMPREHENSIVE METABOLIC PANEL - Abnormal; Notable for the following:    GFR calc non Af Amer 71 (*)    GFR calc Af Amer 82 (*)    All other components within normal limits  URINE MICROSCOPIC-ADD ON - Abnormal; Notable for the following:    Squamous Epithelial / LPF FEW (*)    All other components within normal limits  PREGNANCY, URINE  CBC WITH DIFFERENTIAL  LIPASE, BLOOD    Imaging Review US Abdomen Complete  10/06/2013   CLINICAL DATA:  Right upper quadrant pain, nausea, vomiting  EXAM: ULTRASOUND ABDOMEN COMPLETE  COMPARISON:  None.  FINDINGS: Gallbladder:  No gallstones or wall thickening visualized. No sonographic Murphy sign noted.  Common bile duct:  Diameter: 5 mm  Liver:  Diffusely increased in echogenicity.  No definite focal lesion.  IVC:  No abnormality visualized.  Pancreas:  Visualized portion unremarkable.  Spleen:  Size and appearance within normal limits.  Right Kidney:  Length: 12.6 cm. Echogenicity within normal limits. No mass or hydronephrosis visualized.  Left Kidney:  Length: 12.5 cm. Echogenicity within normal limits. No mass or hydronephrosis visualized.  Abdominal aorta:  No aneurysm visualized.  Other findings:  None.  IMPRESSION: Hepatic steatosis.  No cause for acute abdominal pain identified.   Electronically Signed   By: Annia Belt M.D.   On: 10/06/2013 19:54     EKG Interpretation None      MDM   Final diagnoses:  Non-intractable vomiting with nausea, vomiting of unspecified type  Diarrhea  Abdominal pain, unspecified abdominal location    6:12 PM 38 y.o. female who presents with nausea, vomiting, diarrhea, and upper abdominal  pain for the last 6 days. She denies any fevers. She is able to tolerate by mouth. She saw her PCP who recommended she come here for evaluation. Will get labs, IV fluids, nausea control. The patient refuses any narcotic pain medicines. Will get ultrasound to rule out gallbladder pathology.  8:25 PM: I interpreted/reviewed the labs and/or imaging which were non-contributory.  I offered CT abd vs going home w/ symptomatic therapy and returning for any worsening. Pt would prefer to go home. Given unremarkable labs/imaging I think this is reasonable.  I have discussed the diagnosis/risks/treatment options with the patient and believe the pt to be eligible for discharge home to follow-up with pcp in 1-2 days. We also discussed returning to the ED immediately if new or worsening sx occur. We discussed the sx which are most concerning (e.g., worsening pain, fever) that necessitate immediate return. Medications administered to the patient during their visit and any new prescriptions provided to the patient are listed below.  Medications given during this  visit Medications  ondansetron (ZOFRAN) injection 4 mg (4 mg Intravenous Given 10/06/13 1740)  sodium chloride 0.9 % bolus 1,000 mL (0 mLs Intravenous Stopped 10/06/13 1840)  acetaminophen (TYLENOL) tablet 650 mg (650 mg Oral Given 10/06/13 1814)    Discharge Medication List as of 10/06/2013  8:27 PM    START taking these medications   Details  ondansetron (ZOFRAN ODT) 4 MG disintegrating tablet 4mg  ODT q4 hours prn nausea/vomit, Print    oxyCODONE-acetaminophen (PERCOCET) 5-325 MG per tablet Take 1 tablet by mouth every 6 (six) hours as needed for moderate pain., Starting 10/06/2013, Until Discontinued, Print    pantoprazole (PROTONIX) 20 MG tablet Take 1 tablet (20 mg total) by mouth daily., Starting 10/06/2013, Until Discontinued, Print         Junius Argyle, MD 10/07/13 1009

## 2013-10-06 NOTE — ED Notes (Signed)
MD at bedside. 

## 2014-01-04 ENCOUNTER — Other Ambulatory Visit: Payer: Self-pay | Admitting: Family Medicine

## 2014-01-04 DIAGNOSIS — R1032 Left lower quadrant pain: Secondary | ICD-10-CM

## 2014-01-11 ENCOUNTER — Ambulatory Visit
Admission: RE | Admit: 2014-01-11 | Discharge: 2014-01-11 | Disposition: A | Discharge status: Home / Self Care | Payer: BC Managed Care – PPO | Source: Ambulatory Visit | Attending: Family Medicine | Admitting: Family Medicine

## 2014-01-11 ENCOUNTER — Encounter (HOSPITAL_BASED_OUTPATIENT_CLINIC_OR_DEPARTMENT_OTHER): Payer: Self-pay | Admitting: Emergency Medicine

## 2014-01-11 DIAGNOSIS — R1032 Left lower quadrant pain: Secondary | ICD-10-CM

## 2014-01-11 MED ORDER — IOHEXOL 300 MG/ML  SOLN
125.0000 mL | Freq: Once | INTRAMUSCULAR | Status: AC | PRN
  Administered 2014-01-11: 125 mL via INTRAVENOUS

## 2014-08-16 ENCOUNTER — Emergency Department (HOSPITAL_BASED_OUTPATIENT_CLINIC_OR_DEPARTMENT_OTHER): Payer: BLUE CROSS/BLUE SHIELD

## 2014-08-16 ENCOUNTER — Encounter (HOSPITAL_BASED_OUTPATIENT_CLINIC_OR_DEPARTMENT_OTHER): Payer: Self-pay | Admitting: Emergency Medicine

## 2014-08-16 ENCOUNTER — Emergency Department (HOSPITAL_BASED_OUTPATIENT_CLINIC_OR_DEPARTMENT_OTHER)
Admission: EM | Admit: 2014-08-16 | Discharge: 2014-08-16 | Disposition: A | Discharge status: Home / Self Care | Payer: BLUE CROSS/BLUE SHIELD | Attending: Emergency Medicine | Admitting: Emergency Medicine

## 2014-08-16 DIAGNOSIS — M79605 Pain in left leg: Secondary | ICD-10-CM

## 2014-08-16 DIAGNOSIS — Z86718 Personal history of other venous thrombosis and embolism: Secondary | ICD-10-CM | POA: Diagnosis not present

## 2014-08-16 DIAGNOSIS — M79652 Pain in left thigh: Secondary | ICD-10-CM | POA: Insufficient documentation

## 2014-08-16 DIAGNOSIS — M1990 Unspecified osteoarthritis, unspecified site: Secondary | ICD-10-CM | POA: Diagnosis not present

## 2014-08-16 DIAGNOSIS — Z79899 Other long term (current) drug therapy: Secondary | ICD-10-CM | POA: Insufficient documentation

## 2014-08-16 DIAGNOSIS — Z7982 Long term (current) use of aspirin: Secondary | ICD-10-CM | POA: Insufficient documentation

## 2014-08-16 HISTORY — DX: Acute embolism and thrombosis of unspecified deep veins of unspecified lower extremity: I82.409

## 2014-08-16 HISTORY — DX: Unspecified osteoarthritis, unspecified site: M19.90

## 2014-08-16 LAB — BASIC METABOLIC PANEL
Anion gap: 9 (ref 5–15)
BUN: 12 mg/dL (ref 6–20)
CALCIUM: 8.8 mg/dL — AB (ref 8.9–10.3)
CHLORIDE: 104 mmol/L (ref 101–111)
CO2: 25 mmol/L (ref 22–32)
Creatinine, Ser: 0.83 mg/dL (ref 0.44–1.00)
GFR calc Af Amer: >60 mL/min (ref >60)
GFR calc non Af Amer: >60 mL/min (ref >60)
Glucose, Bld: 98 mg/dL (ref 65–99)
Potassium: 3.9 mmol/L (ref 3.5–5.1)
SODIUM: 138 mmol/L (ref 135–145)

## 2014-08-16 LAB — CBC
HEMATOCRIT: 37.2 % (ref 36.0–46.0)
Hemoglobin: 12.2 g/dL (ref 12.0–15.0)
MCH: 25.8 pg — AB (ref 26.0–34.0)
MCHC: 32.8 g/dL (ref 30.0–36.0)
MCV: 78.8 fL (ref 78.0–100.0)
PLATELETS: 349 10*3/uL (ref 150–400)
RBC: 4.72 MIL/uL (ref 3.87–5.11)
RDW: 13.4 % (ref 11.5–15.5)
WBC: 8 10*3/uL (ref 4.0–10.5)

## 2014-08-16 NOTE — ED Notes (Signed)
Pt states left leg started hurting weeks ago but today she woke up and it was hurting worse, Pt has past hx of DVT

## 2014-08-16 NOTE — ED Provider Notes (Signed)
CSN: [id number]     Arrival date & time 08/16/14  1733 History  This chart was scribed for Renee Scott, MD by Renee Howard, ED Scribe. This patient was seen in room MH01/MH01 and the patient's care was started at 6:28 PM.    Chief Complaint  Patient presents with  . Leg Pain      The history is provided by the patient. No language interpreter was used.    HPI Comments: Renee Howard is a 39 y.o. female with a PMHx of DVT presents to the Emergency Department complaining of constant gradual worsening left leg pain onset today. Pt says the pain is a "cramping" felling and radiates from her left leg to her upper thigh. Pt notes her leg has had been hurting for weeks but today the pain suddenly worsened.  Pt finished her course of blood thinners several years ago. Pt denies traveling long distances.  No leg swelling.  She has not taken anything for her symptoms.  There are no other associated systemic symptoms, there are no other alleviating or modifying factors.  Denies new activities or trauma.    Past Medical History  Diagnosis Date  . No pertinent past medical history   . PONV (postoperative nausea and vomiting)   . DVT (deep venous thrombosis)   . Arthritis    Past Surgical History  Procedure Laterality Date  . Cesarean section    . Hernia repair     History reviewed. No pertinent family history. History  Substance Use Topics  . Smoking status: Never Smoker   . Smokeless tobacco: Not on file  . Alcohol Use: No   OB History    Gravida Para Term Preterm AB TAB SAB Ectopic Multiple Living   2 1 1       1      Review of Systems  Musculoskeletal: Positive for arthralgias.  All other systems reviewed and are negative.     Allergies  Review of patient's allergies indicates no known allergies.  Home Medications   Prior to Admission medications   Medication Sig Start Date End Date Taking? Authorizing Provider  aspirin 81 MG tablet Take 81 mg by mouth daily.   Yes  Historical Provider, MD  acetaminophen (TYLENOL) 500 MG tablet Take 1,000 mg by mouth every 6 (six) hours as needed. For pain    Historical Provider, MD  ferrous sulfate 325 (65 FE) MG tablet Take 1 tablet (325 mg total) by mouth daily. 06/10/12   08/10/12, PA-C  hydroxychloroquine (PLAQUENIL) 200 MG tablet Take by mouth 2 (two) times daily.    Historical Provider, MD  ondansetron (ZOFRAN ODT) 4 MG disintegrating tablet 4mg  ODT q4 hours prn nausea/vomit 10/06/13   , MD  oxyCODONE-acetaminophen (PERCOCET) 5-325 MG per tablet Take 1 tablet by mouth every 6 (six) hours as needed for moderate pain. 10/06/13   Purvis Sheffield, MD  pantoprazole (PROTONIX) 20 MG tablet Take 1 tablet (20 mg total) by mouth daily. 10/06/13   Purvis Sheffield, MD   Triage vitals: BP 136/82 mmHg  Pulse 82  Temp(Src) 98.1 F (36.7 C) (Oral)  Resp 18  Ht 5\' 10"  (1.778 m)  Wt 315 lb (142.883 kg)  BMI 45.20 kg/m2  SpO2 94%  LMP 08/09/2014 Vitals reviewed Physical Exam  Physical Examination: General appearance - alert, well appearing, and in no distress Mental status - alert, oriented to person, place, and time Eyes - no conjunctival injection, no scleral icterus Chest - clear to auscultation,  no wheezes, rales or rhonchi, symmetric air entry Neurological - alert, oriented, sensation intact in lower extremities, normal gait Musculoskeletal - no joint tenderness, deformity or swelling, ttp over medial left thigh, positive homan's sign Extremities - peripheral pulses normal, no pedal edema, no clubbing or cyanosis Skin - normal coloration and turgor, no rashes  ED Course  Procedures  DIAGNOSTIC STUDIES: Oxygen Saturation is 94% on RA, low by my interpretation.  COORDINATION OF CARE: 6:29 PM Discussed treatment plan which includes ultrasound with pt at bedside and pt agreed to plan.  Labs Review Labs Reviewed  CBC - Abnormal; Notable for the following:    MCH 25.8 (*)    All other components  within normal limits  BASIC METABOLIC PANEL - Abnormal; Notable for the following:    Calcium 8.8 (*)    All other components within normal limits    Imaging Review US Venous Img Lower Unilateral Left  08/16/2014   CLINICAL DATA:  New history of DVT and LEFT popliteal fossa 10 years ago. Now having pain in LEFT lower extremity for 1 month. Symptoms worse today.  EXAM: LEFT LOWER EXTREMITY VENOUS DOPPLER ULTRASOUND  TECHNIQUE: Gray-scale sonography with graded compression, as well as color Doppler and duplex ultrasound were performed to evaluate the lower extremity deep venous systems from the level of the common femoral vein and including the common femoral, femoral, profunda femoral, popliteal and calf veins including the posterior tibial, peroneal and gastrocnemius veins when visible. The superficial great saphenous vein was also interrogated. Spectral Doppler was utilized to evaluate flow at rest and with distal augmentation maneuvers in the common femoral, femoral and popliteal veins.  COMPARISON:  05/26/2012 venous duplex sonography performed at [facility]; study was normal.  FINDINGS: Contralateral Common Femoral Vein: Respiratory phasicity is normal and symmetric with the symptomatic side. No evidence of thrombus. Normal compressibility.  Common Femoral Vein: No evidence of thrombus. Normal compressibility, respiratory phasicity and response to augmentation.  Saphenofemoral Junction: No evidence of thrombus. Normal compressibility and flow on color Doppler imaging.  Profunda Femoral Vein: No evidence of thrombus. Normal compressibility and flow on color Doppler imaging.  Femoral Vein: No evidence of thrombus. Normal compressibility, respiratory phasicity and response to augmentation.  Popliteal Vein: No evidence of thrombus. Normal compressibility, respiratory phasicity and response to augmentation.  Calf Veins: Not well seen.  Superficial Great Saphenous Vein: No evidence of thrombus. Normal  compressibility and flow on color Doppler imaging.  Venous Reflux:  None.  Other Findings:  None.  IMPRESSION: No evidence of deep venous thrombosis.   Electronically Signed   By: Davonna Belling M.D.   On: 08/16/2014 19:22     EKG Interpretation None      MDM   Final diagnoses:  Diffuse pain in left lower extremity    Pt with prior hx of DVT presenting with pain in left calf and medial left thigh for the past several weeks.  No swelling, no injury.  DVT study obtained and negative for DVT.  Discharged with strict return precautions.  Pt agreeable with plan.  Nursing notes including past medical history and social history reviewed and considered in documentation    I personally performed the services described in this documentation, which was scribed in my presence. The recorded information has been reviewed and is accurate.   Renee Scott, MD 08/16/14 [phone]

## 2014-08-16 NOTE — Discharge Instructions (Signed)
Return to the ED with any concerns including swelling of legs, chest pain, difficulty breathing, decreased level of alertness/lethargy, or any other alarming symptoms

## 2014-08-16 NOTE — ED Notes (Signed)
Pt returned from US

## 2014-09-03 ENCOUNTER — Encounter: Payer: Self-pay | Admitting: Neurology

## 2014-09-03 ENCOUNTER — Ambulatory Visit (INDEPENDENT_AMBULATORY_CARE_PROVIDER_SITE_OTHER): Payer: BLUE CROSS/BLUE SHIELD | Admitting: Neurology

## 2014-09-03 VITALS — BP 128/80 | HR 82 | Resp 16 | Ht 70.0 in | Wt 333.8 lb

## 2014-09-03 DIAGNOSIS — M069 Rheumatoid arthritis, unspecified: Secondary | ICD-10-CM

## 2014-09-03 DIAGNOSIS — M79602 Pain in left arm: Secondary | ICD-10-CM | POA: Diagnosis not present

## 2014-09-03 DIAGNOSIS — R200 Anesthesia of skin: Secondary | ICD-10-CM

## 2014-09-03 DIAGNOSIS — M5412 Radiculopathy, cervical region: Secondary | ICD-10-CM

## 2014-09-03 DIAGNOSIS — G5602 Carpal tunnel syndrome, left upper limb: Secondary | ICD-10-CM

## 2014-09-03 DIAGNOSIS — G5600 Carpal tunnel syndrome, unspecified upper limb: Secondary | ICD-10-CM | POA: Insufficient documentation

## 2014-09-03 DIAGNOSIS — R269 Unspecified abnormalities of gait and mobility: Secondary | ICD-10-CM | POA: Diagnosis not present

## 2014-09-03 DIAGNOSIS — M0579 Rheumatoid arthritis with rheumatoid factor of multiple sites without organ or systems involvement: Secondary | ICD-10-CM | POA: Insufficient documentation

## 2014-09-03 DIAGNOSIS — R350 Frequency of micturition: Secondary | ICD-10-CM | POA: Diagnosis not present

## 2014-09-03 HISTORY — DX: Anesthesia of skin: R20.0

## 2014-09-03 HISTORY — DX: Pain in left arm: M79.602

## 2014-09-03 HISTORY — DX: Carpal tunnel syndrome, unspecified upper limb: G56.00

## 2014-09-03 HISTORY — DX: Rheumatoid arthritis with rheumatoid factor of multiple sites without organ or systems involvement: M05.79

## 2014-09-03 HISTORY — DX: Radiculopathy, cervical region: M54.12

## 2014-09-03 HISTORY — DX: Unspecified abnormalities of gait and mobility: R26.9

## 2014-09-03 HISTORY — DX: Frequency of micturition: R35.0

## 2014-09-03 NOTE — Patient Instructions (Signed)
Please call us if you have any new or worsening neurologic symptoms.

## 2014-09-03 NOTE — Progress Notes (Signed)
[facility]  PATIENT: Renee Howard DOB: January 15, 1976  REFERRING DOCTOR OR PCP:  Mitchel Honour ([facility]) SOURCE: patient and records from Dr. Langston Masker.   _________________________________   HISTORICAL  CHIEF COMPLAINT:  Chief Complaint  Patient presents with  . Numbness    Sts. onset 3 mos. ago of left lower leg pain/numbness with sharp shooting pain into left  thigh.  Sts. sx. are worse around the time of her period.  Some relief with Ibuprofen.  Denies known injury. Sts. hx. of blood clot in left leg 10 yrs. ago. Sts. doppler study done [facility] ago [facility] was negative for dvt./fim    HISTORY OF PRESENT ILLNESS:  I had the pleasure of seeing your patient, Renee Howard, at [facility] for neurologic consultation regarding her left leg dysesthesias. As you know, she is a 39 year old woman who began to experience numbness that would be intermittent in the left leg about 3 or 4 months ago. The numbness would predominantly be in the lower leg and enter the foot she also would get a more painful tingling in the thigh that would radiate towards the groin. Earlier, about a year ago, she began to also note that there was some difficulty with her gait at times with occasional stumbles and some clumsiness of the left leg. Additionally, about a year ago she began to experience a more painful tingling in the left arm that was also intermittent. The symptoms of the leg are more troublesome and more severe to her than the symptoms in the arm. She also noted mild sensory changes in the left face that are painless.  Over the past year, she also has noted an increase in urinary frequency. She used to have 1 times nocturia and now has 4 times nocturia. She denies any change in her symptoms related to a picture or position. However, she does note that the leg symptoms are usually worse when she is having her period. She denies any difficulty  with her vision.  A couple weeks ago, she went to the [facility]. Her leg symptoms were acting up more severely at that time. She was concerned about a deep vein thrombosis and a Doppler study of the legs was performed which did not find any abnormality.  In 2014, she was diagnosed with rheumatoid arthritis due to joint pain that was predominantly on her left side and mostly in her left leg, , foot and hand. She started on Plaquenil and continues on that medication.  She sees Dr. Corliss Skains with [facility] orthoped[facility].    REVIEW OF SYSTEMS: Constitutional: No fevers, chills, sweats, or change in appetite Eyes: No visual changes, double vision, eye pain Ear, nose and throat: No hearing loss, ear pain, nasal congestion, sore throat Cardiovascular: No chest pain, palpitations Respiratory: No shortness of breath at rest or with exertion.   No wheezes GastrointestinaI: No nausea, vomiting, diarrhea, abdominal pain, fecal incontinence Genitourinary: No dysuria, urinary retention or frequency.  No nocturia. Musculoskeletal: No neck pain, back pain Integumentary: No rash, pruritus, skin lesions Neurological: as above Psychiatric: No depression at this time.  No anxiety Endocrine: No palpitations, diaphoresis, change in appetite, change in weigh or increased thirst Hematologic/Lymphatic: No anemia, purpura, petechiae. Allergic/Immunologic: No itchy/runny eyes, nasal congestion, recent allergic reactions, rashes  ALLERGIES: No Known Allergies  HOME MEDICATIONS:  Current outpatient prescriptions:  .  acetaminophen (TYLENOL) 500 MG tablet, Take 1,000 mg by mouth every 6 (  six) hours as needed. For pain, Disp: , Rfl:  .  aspirin 81 MG tablet, Take 81 mg by mouth daily., Disp: , Rfl:  .  ferrous sulfate 325 (65 FE) MG tablet, Take 1 tablet (325 mg total) by mouth daily., Disp: 30 tablet, Rfl: 0 .  hydroxychloroquine (PLAQUENIL) 200 MG tablet, Take by mouth 2 (two) times daily.,  Disp: , Rfl:  .  Multiple Vitamin (MULTIVITAMIN) tablet, Take 1 tablet by mouth daily., Disp: , Rfl:  .  ondansetron (ZOFRAN ODT) 4 MG disintegrating tablet, 4mg  ODT q4 hours prn nausea/vomit (Patient not taking: Reported on 09/03/2014), Disp: 20 tablet, Rfl: 0 .  oxyCODONE-acetaminophen (PERCOCET) 5-325 MG per tablet, Take 1 tablet by mouth every 6 (six) hours as needed for moderate pain. (Patient not taking: Reported on 09/03/2014), Disp: 10 tablet, Rfl: 0 .  pantoprazole (PROTONIX) 20 MG tablet, Take 1 tablet (20 mg total) by mouth daily. (Patient not taking: Reported on 09/03/2014), Disp: 30 tablet, Rfl: 1  PAST MEDICAL HISTORY: Past Medical History  Diagnosis Date  . No pertinent past medical history   . PONV (postoperative nausea and vomiting)   . DVT (deep venous thrombosis)   . Arthritis     PAST SURGICAL HISTORY: Past Surgical History  Procedure Laterality Date  . Cesarean section    . Hernia repair      FAMILY HISTORY: History reviewed. No pertinent family history.  SOCIAL HISTORY:  History   Social History  . Marital Status: Married    Spouse Name: N/A  . Number of Children: N/A  . Years of Education: N/A   Occupational History  . Not on file.   Social History Main Topics  . Smoking status: Never Smoker   . Smokeless tobacco: Not on file  . Alcohol Use: No  . Drug Use: No  . Sexual Activity: Yes    Birth Control/ Protection: None   Other Topics Concern  . Not on file   Social History Narrative     PHYSICAL EXAM  Filed Vitals:   09/03/14 0840  BP: 128/80  Pulse: 82  Resp: 16  Height: 5\' 10"  (1.778 m)  Weight: 333 lb 12.8 oz (151.411 kg)    Body mass index is 47.9 kg/(m^2).   General: The patient is well-developed and well-nourished and in no acute distress  Eyes:  Funduscopic exam shows normal optic discs and retinal vessels.  Neck: The neck is supple, no carotid bruits are noted.  The neck is nontender.  Cardiovascular: The heart has a  regular rate and rhythm with a normal S1 and S2. There were no murmurs, gallops or rubs. Lungs are clear to auscultation.  Skin: Extremities are without significant edema.  Musculoskeletal:  Back is nontender  Neurologic Exam  Mental status: The patient is alert and oriented x 3 at the time of the examination. The patient has apparent normal recent and remote memory, with an apparently normal attention span and concentration ability.   Speech is normal.  Cranial nerves: Extraocular movements are full. Pupils are equal, round, and reactive to light and accomodation.  Visual fields are full.  Facial symmetry is present. There is good facial sensation to soft touch bilaterally.Facial strength is normal.  Trapezius and sternocleidomastoid strength is normal. No dysarthria is noted.  The tongue is midline, and the patient has symmetric elevation of the soft palate. No obvious hearing deficits are noted.  Motor:  Muscle bulk is normal.   Tone is normal. Strength is  5 /  5 in all 4 extremities.   Sensory: Sensory testing is intact to pinprick, soft touch and vibration sensation in all 4 extremities.  She has a positive Tinel sign at the left wrist.  Coordination: Cerebellar testing reveals good finger-nose-finger and heel-to-shin bilaterally.  Gait and station: Station is normal.   Gait is normal. Tandem gait is mildly wide. Romberg is negative.   Reflexes: Deep tendon reflexes are symmetric and normal bilaterally.   Plantar responses are flexor.    DIAGNOSTIC DATA (LABS, IMAGING, TESTING) - I reviewed patient records, labs, notes, testing and imaging myself where available.  Lab Results  Component Value Date   WBC 8.0 08/16/2014   HGB 12.2 08/16/2014   HCT 37.2 08/16/2014   MCV 78.8 08/16/2014   PLT 349 08/16/2014      Component Value Date/Time   NA 138 08/16/2014 1820   K 3.9 08/16/2014 1820   CL 104 08/16/2014 1820   CO2 25 08/16/2014 1820   GLUCOSE 98 08/16/2014 1820   BUN 12  08/16/2014 1820   CREATININE 0.83 08/16/2014 1820   CALCIUM 8.8* 08/16/2014 1820   PROT 7.5 10/06/2013 1735   ALBUMIN 3.9 10/06/2013 1735   AST 21 10/06/2013 1735   ALT 20 10/06/2013 1735   ALKPHOS 70 10/06/2013 1735   BILITOT 0.5 10/06/2013 1735   GFRNONAA >60 08/16/2014 1820   GFRAA >60 08/16/2014 1820       ASSESSMENT AND PLAN  Numbness - Plan: MR Cervical Spine Wo Contrast  Urinary frequency - Plan: MR Cervical Spine Wo Contrast  Gait disturbance  Arm pain, left - Plan: MR Cervical Spine Wo Contrast  Carpal tunnel syndrome of left wrist  Cervical radiculitis   In summary, Renee Howard is a 39 year old woman who has had fluctuating numbness in the left leg that has become more intense recently. Additionally, she has had worsening urinary frequency and now has nocturia 4 times a night. She also has had left arm discomfort and burning pain because of the combination of these symptoms and her rheumatoid arthritis, I'm most concerned with a cervical spine process with possible myelopathy along with cervical radiculopathy and we need to obtain an MRI of the cervical spine to better assess this possibility to determine the best therapy for her. I offered to have her start medication at night to help with her nocturia but she would prefer to wait to her evaluation she also appears to have a mild carpal tunnel syndrome on the left that is unrelated to her major issues. He has no weakness I will hold off on EMG/NCV at this time but reconsider if symptoms worsen.  She will return to see me in 6 weeks or sooner if she has new or worsening neurologic symptoms.   Richard A. Epimenio Foot, MD, PhD 09/03/2014, 8:49 AM Certified in Neurology, Clinical Neurophysiology, Sleep Medicine, Pain Medicine and Neuroimaging  [facility] [address], [address] [place], [place] [address] [phone]

## 2014-10-19 ENCOUNTER — Ambulatory Visit: Payer: BLUE CROSS/BLUE SHIELD | Admitting: Neurology

## 2014-10-29 ENCOUNTER — Ambulatory Visit (HOSPITAL_COMMUNITY): Payer: BLUE CROSS/BLUE SHIELD

## 2014-11-05 ENCOUNTER — Ambulatory Visit (HOSPITAL_COMMUNITY)
Admission: RE | Admit: 2014-11-05 | Discharge: 2014-11-05 | Disposition: A | Discharge status: Home / Self Care | Payer: BLUE CROSS/BLUE SHIELD | Source: Ambulatory Visit | Attending: Obstetrics & Gynecology | Admitting: Obstetrics & Gynecology

## 2014-11-05 DIAGNOSIS — Q512 Other doubling of uterus: Secondary | ICD-10-CM | POA: Insufficient documentation

## 2014-11-05 DIAGNOSIS — M069 Rheumatoid arthritis, unspecified: Secondary | ICD-10-CM | POA: Insufficient documentation

## 2014-11-05 DIAGNOSIS — Z7982 Long term (current) use of aspirin: Secondary | ICD-10-CM | POA: Diagnosis not present

## 2014-11-05 DIAGNOSIS — Z3169 Encounter for other general counseling and advice on procreation: Secondary | ICD-10-CM | POA: Diagnosis present

## 2014-11-05 DIAGNOSIS — E282 Polycystic ovarian syndrome: Secondary | ICD-10-CM | POA: Diagnosis not present

## 2014-11-05 NOTE — Consult Note (Signed)
Maternal Fetal Medicine Consultation  Requesting Provider(s): Mitchel Honour, DO  Reason for consultation: Hx of rheumatoid arthritis for preconception counseling  HPI: Renee Howard is a 39 year-old G2P1011 seen for con conception counseling.  Renee Howard has secondary infertility and was recently diagnosed with polycystic ovarian syndrome.  She has a history of rheumatoid arthritis and is currently followed by  Dr. Corliss Skains from  [facility] orthopedics.  Her symptoms are primarily related to arthritis and join pain in the feet, knees, fingers and hips.  She is currently on Plaquenil which seems to control her symptoms. She denies any systemic problems - no known renal disorders or skin rashes.  She also reports a history of "low grade" anemia - she takes iron supplements every several days.  Additionally, she reports undergoing a work up for a possible DVT - lower extremity Doppler studies were normal, although she does take a daily baby aspirin for ? Superficial clots (cannot find any documentation of this in the patient's medical records).  The patient was recently see by Neurology due to lower extremity pain and numbness.  They recommended an MRI - results were not available at the time of evaluation.  There is also some question of a uterine septum, although I am unable to find any documentation of a prior HSG or hysteroscopy or how extensive this indeed is.  Her past OB history is remarkable for a prior 10 week SAB and a term C-section in 2009 of a 9# 9oz infant.  She reports that this pregnancy was otherwise uncomplicated.  The patient reports that she had chicken pox as a child.  Renee Howard was under the impression that she would be seeing and infertility specialist today and does not quite understand why she is being seen by Maternal Fetal Medicine.  OB History: OB History    Gravida Para Term Preterm AB TAB SAB Ectopic Multiple Living   2 1 1       1       PMH:  Past Medical History  Diagnosis  Date  . No pertinent past medical history   . PONV (postoperative nausea and vomiting)   . DVT (deep venous thrombosis)   . Arthritis     PSH:  Past Surgical History  Procedure Laterality Date  . Cesarean section    . Hernia repair     Meds:  Current Outpatient Prescriptions on File Prior to Encounter  Medication Sig Dispense Refill  . hydroxychloroquine (PLAQUENIL) 200 MG tablet Take by mouth 2 (two) times daily.    . Multiple Vitamin (MULTIVITAMIN) tablet Take 1 tablet by mouth daily.    acetaminophen (TYLENOL) 500 MG tablet Take 1,000 mg by mouth every 6 (six) hours as needed. For pain    . aspirin 81 MG tablet Take 81 mg by mouth daily.    . ferrous sulfate 325 (65 FE) MG tablet Take 1 tablet (325 mg total) by mouth daily. 30 tablet 0  . ondansetron (ZOFRAN ODT) 4 MG disintegrating tablet 4mg  ODT q4 hours prn nausea/vomit (Patient not taking: Reported on 09/03/2014) 20 tablet 0  . oxyCODONE-acetaminophen (PERCOCET) 5-325 MG per tablet Take 1 tablet by mouth every 6 (six) hours as needed for moderate pain. (Patient not taking: Reported on 09/03/2014) 10 tablet 0  . pantoprazole (PROTONIX) 20 MG tablet Take 1 tablet (20 mg total) by mouth daily. (Patient not taking: Reported on 09/03/2014) 30 tablet 1   No current facility-administered medications on file prior to encounter.  Allergies: No Known Allergies   FH: reports that her father had cancer of the gall bladder.  Denies a family history of birth defects or hereditary disorders.  Soc:  Social History   Social History  . Marital Status: Married    Spouse Name: N/A  . Number of Children: N/A  . Years of Education: N/A   Occupational History  . Not on file.   Social History Main Topics  . Smoking status: Never Smoker   . Smokeless tobacco: Not on file  . Alcohol Use: No  . Drug Use: No  . Sexual Activity: Yes    Birth Control/ Protection: None   Other Topics Concern  . Not on file   Social History Narrative     Review of Systems: no vaginal bleeding or cramping/contractions, no LOF, no nausea/vomiting. All other systems reviewed and are negative.  PE:  131/80, 85   A/P: 1) Hx of Rheumatoid arthritis - in general, rheumatoid arthritis tends to improve over the course of pregnancy, but may be associated with a higher risk for flares post partum.  Her symptoms appear to be well-controlled on Plaquenil which would probably be the first line agent during pregnancy.  She is also on a baby aspirin, for what appears to be a history of ? Lower extremity thrombophlebitis.  This is also safe in pregnancy, but would advise that this should be postponed until after [redacted] weeks gestation.  If she were to develop worsening symptoms during pregnancy, glucocorticoids would be an option, but would defer any other immunosuppressant drugs to Rheumatology.  I do not anticipate any serious complications during pregnancy due to the patient's RA.  2) ? History of lower extremity swelling - no documentation of DVT.  Based on the information available, would not advise Lovenox during pregnancy.  If the patient would desire to continue baby aspirin, this is acceptable after [redacted] weeks gestation.  3) Advanced maternal age - we briefly discussed the increased risk of aneuploidy based on her age.  Would offer cell free fetal DNA or amniocentesis if desired.  We would be happy to offer counseling and further evaluation once she becomes pregnant.  4) Uterine septum - unable to find any documentation of how extensive this is.  With the fact that she previously had a term delivery, this may be of no consequence.  If she does indeed have a significant uterine septum, would consider screening cervical lengths between 16-[redacted] weeks gestation (every 1-2 weeks).  5) Polycystic ovarian syndrome - consider checking a HbA1C at the patient's next visit to screen for diabetes.  I would encourage a prenatal vitamin or folic acid supplementation once the  patient tries to conceive. I do not anticipate that any of these issues will pose a serious problem during pregnancy.  She would very much like to proceed with an infertility evaluation and treatment and is very interested in a consultation with an infertility specialist.  Thank you for the opportunity to be a part of the care of Renee Howard. Please contact our office if we can be of further assistance.   I spent approximately 30 minutes with this patient with over 50% of time spent in face-to-face counseling.  Alpha Gula, MD Maternal Fetal Medicine

## 2014-11-05 NOTE — ED Notes (Signed)
BP-130/70, P-85

## 2014-11-08 ENCOUNTER — Other Ambulatory Visit (HOSPITAL_COMMUNITY): Payer: Self-pay | Admitting: Obstetrics & Gynecology

## 2014-11-26 ENCOUNTER — Other Ambulatory Visit (HOSPITAL_COMMUNITY): Payer: Self-pay | Admitting: Obstetrics & Gynecology

## 2014-11-26 DIAGNOSIS — Z3141 Encounter for fertility testing: Secondary | ICD-10-CM

## 2014-12-02 ENCOUNTER — Ambulatory Visit (HOSPITAL_COMMUNITY): Admission: RE | Admit: 2014-12-02 | Payer: BLUE CROSS/BLUE SHIELD | Source: Ambulatory Visit

## 2014-12-03 ENCOUNTER — Ambulatory Visit (HOSPITAL_COMMUNITY): Payer: BLUE CROSS/BLUE SHIELD

## 2015-05-10 ENCOUNTER — Other Ambulatory Visit: Payer: Self-pay | Admitting: Family Medicine

## 2015-05-10 DIAGNOSIS — R197 Diarrhea, unspecified: Secondary | ICD-10-CM

## 2015-05-10 DIAGNOSIS — K760 Fatty (change of) liver, not elsewhere classified: Secondary | ICD-10-CM

## 2015-05-26 ENCOUNTER — Other Ambulatory Visit: Payer: Self-pay | Admitting: Family Medicine

## 2015-05-26 DIAGNOSIS — R1084 Generalized abdominal pain: Secondary | ICD-10-CM

## 2015-05-26 DIAGNOSIS — K760 Fatty (change of) liver, not elsewhere classified: Secondary | ICD-10-CM

## 2015-06-01 ENCOUNTER — Ambulatory Visit
Admission: RE | Admit: 2015-06-01 | Discharge: 2015-06-01 | Disposition: A | Discharge status: Home / Self Care | Payer: BLUE CROSS/BLUE SHIELD | Source: Ambulatory Visit | Attending: Family Medicine | Admitting: Family Medicine

## 2015-06-01 DIAGNOSIS — K760 Fatty (change of) liver, not elsewhere classified: Secondary | ICD-10-CM

## 2015-06-01 DIAGNOSIS — R1084 Generalized abdominal pain: Secondary | ICD-10-CM

## 2015-06-01 MED ORDER — IOPAMIDOL (ISOVUE-300) INJECTION 61%
125.0000 mL | Freq: Once | INTRAVENOUS | Status: AC | PRN
  Administered 2015-06-01: 125 mL via INTRAVENOUS

## 2015-10-11 IMAGING — US US EXTREM LOW VENOUS*L*
1 series · 13 of 24 positions shown · non-contrast
Comparison: 05/26/2012 venous duplex sonography performed at
[HOSPITAL]; study was normal.

CLINICAL DATA: New history of DVT and LEFT popliteal fossa 10 years
ago. Now having pain in LEFT lower extremity for 1 month. Symptoms
worse today.



[Series 1: us extrem low venous*left* · 0.10mm/px · 29 acquisitions, 13 frames shown]
[im 1/29]
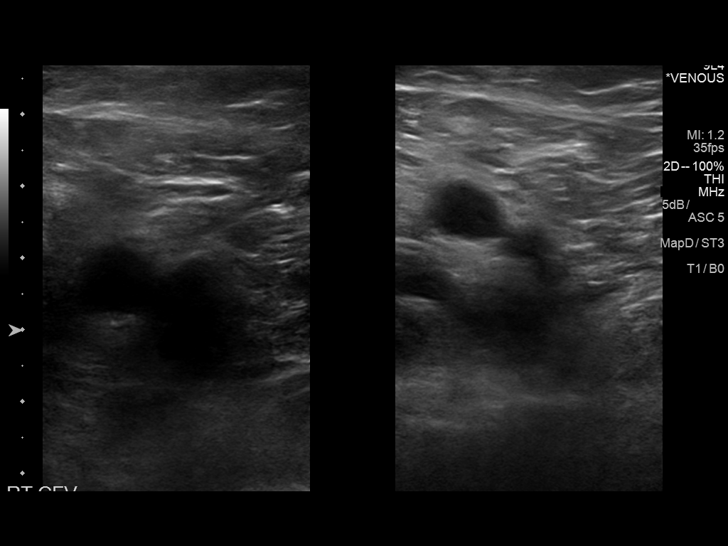
[im 3/29]
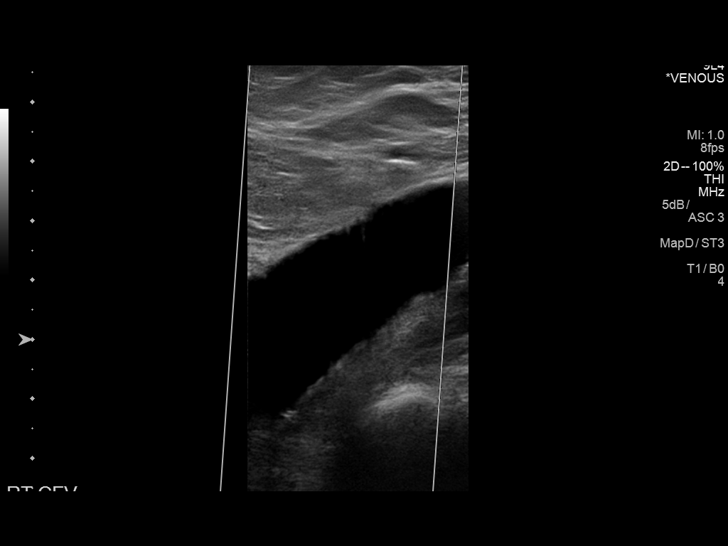
[im 5/29]
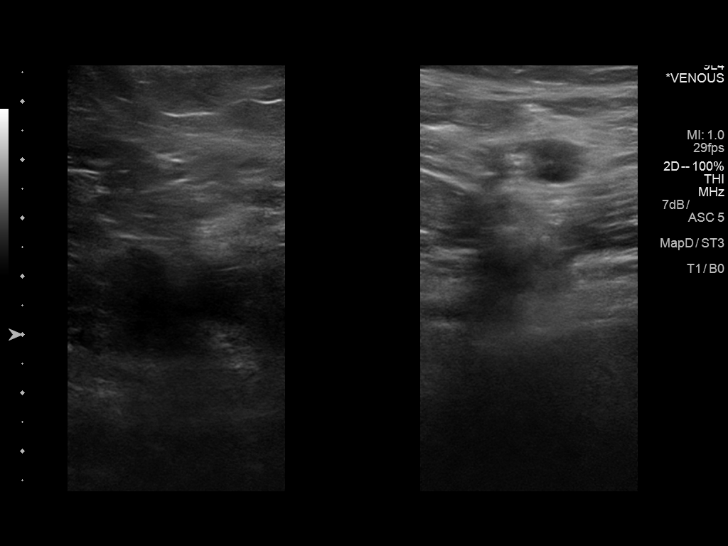
[im 8/29]
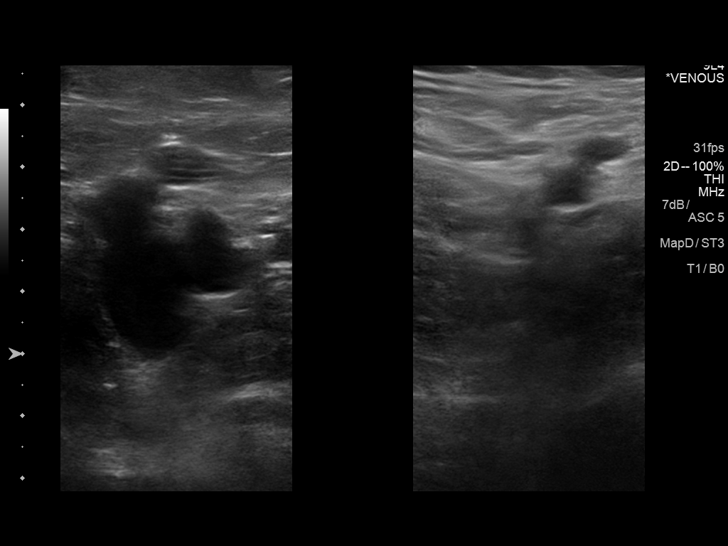
[im 10/29]
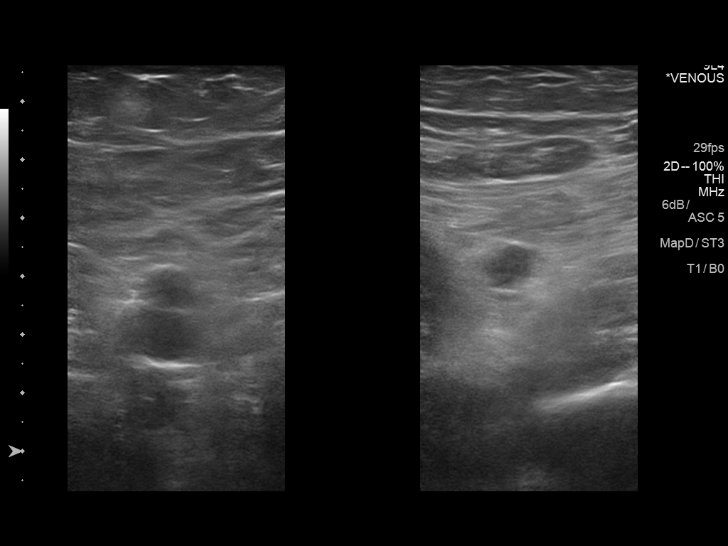
[im 13/29]
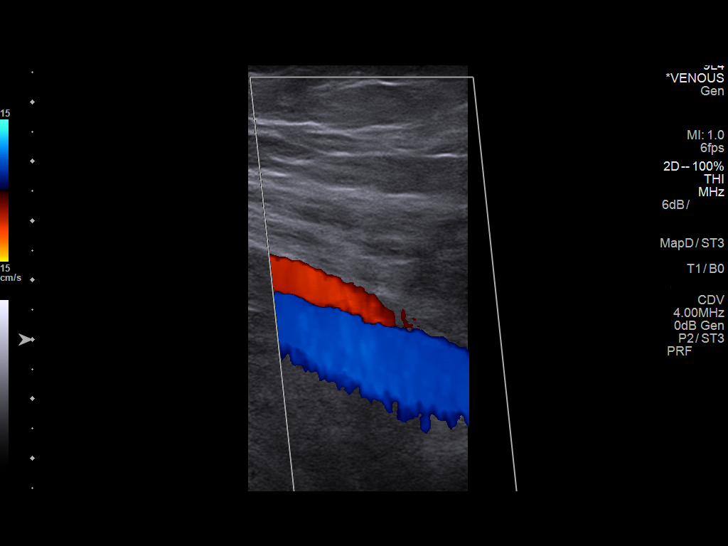
[im 16/29]
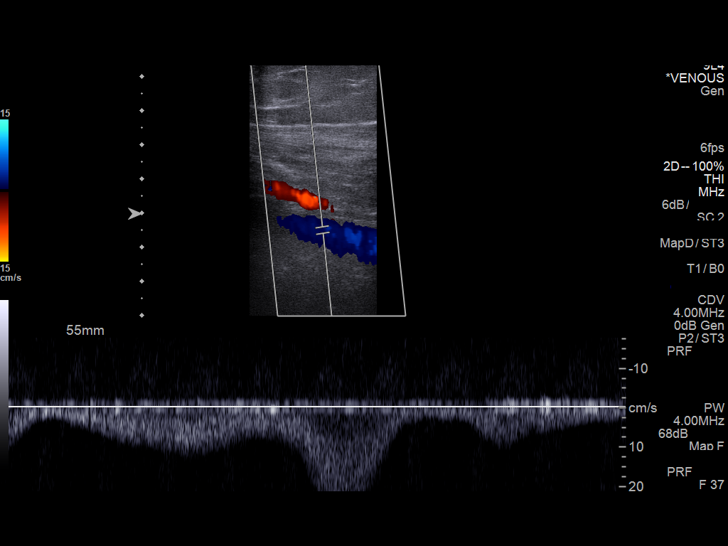
[im 18/29]
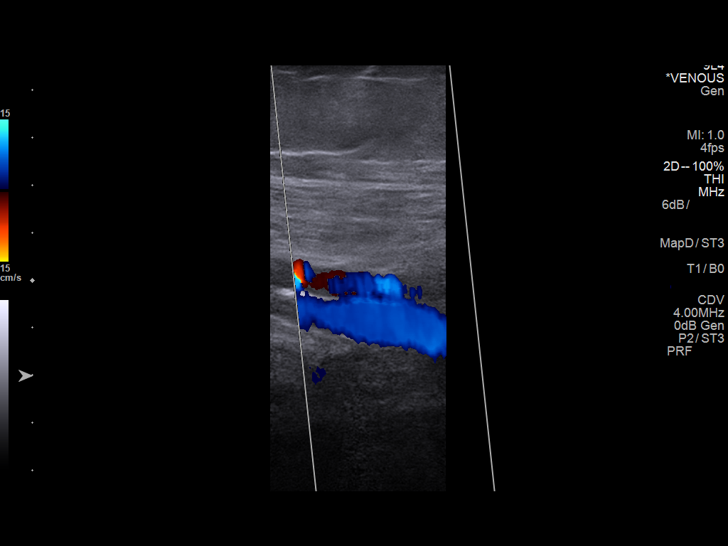
[im 20/29]
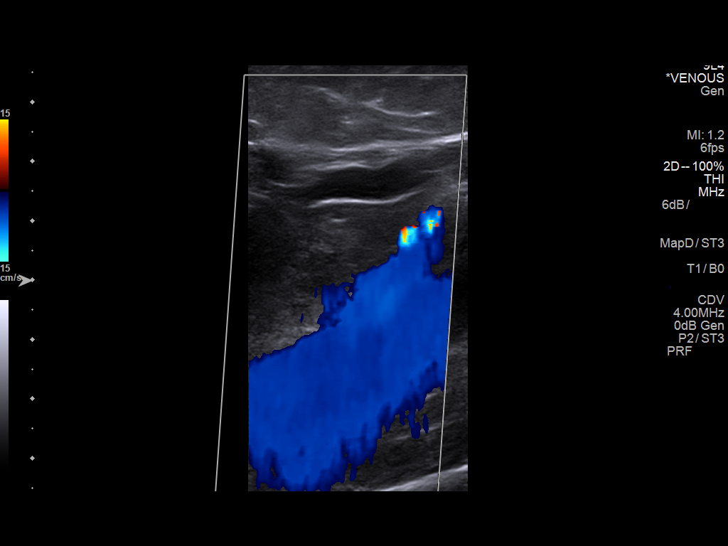
[im 22/29]
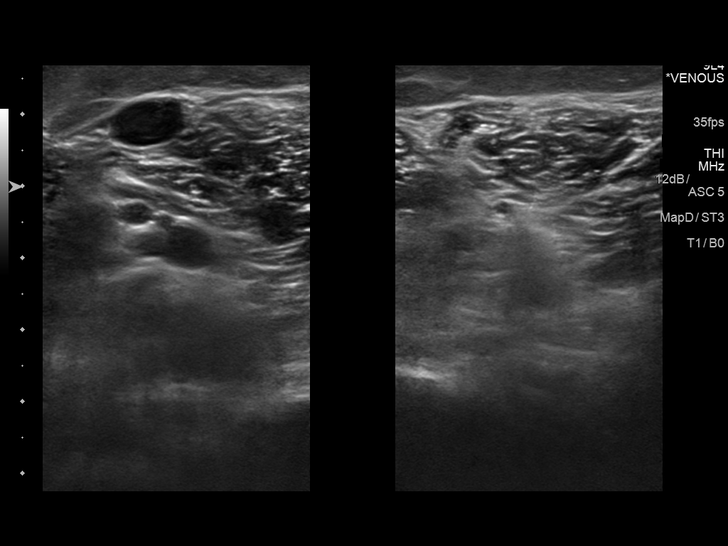
[im 24/29]
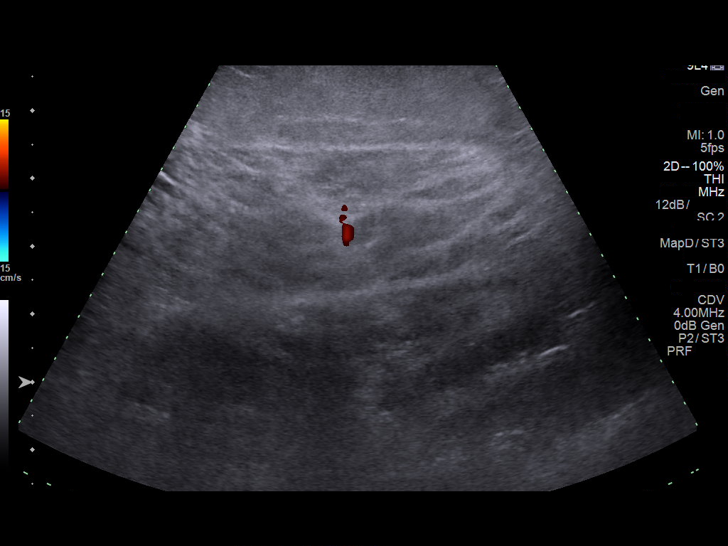
[im 26/29]
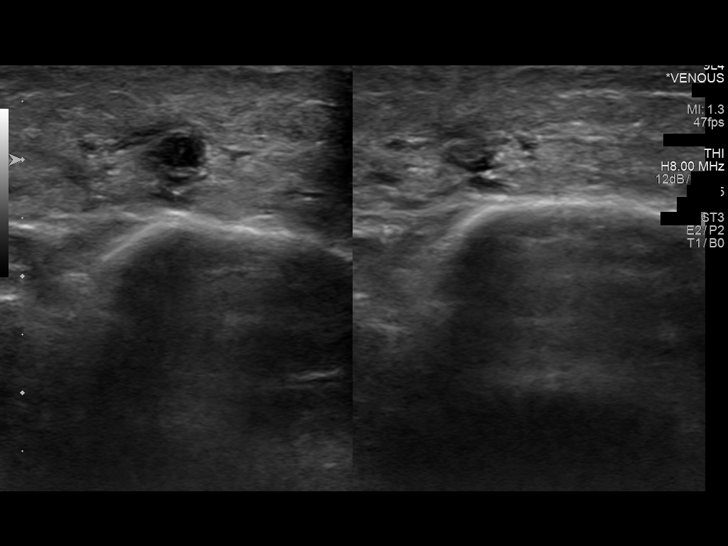
[im 29/29]
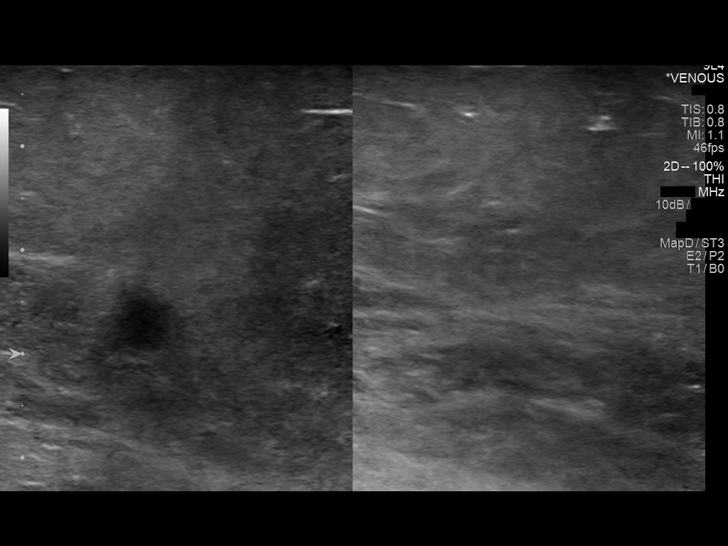

[13 of 24 positions shown; findings below may reference images not displayed]

FINDINGS: Contralateral Common Femoral Vein: Respiratory phasicity is normal
and symmetric with the symptomatic side. No evidence of thrombus.
Normal compressibility.

Common Femoral Vein: No evidence of thrombus. Normal
compressibility, respiratory phasicity and response to augmentation.

Saphenofemoral Junction: No evidence of thrombus. Normal
compressibility and flow on color Doppler imaging.

Profunda Femoral Vein: No evidence of thrombus. Normal
compressibility and flow on color Doppler imaging.

Femoral Vein: No evidence of thrombus. Normal compressibility,
respiratory phasicity and response to augmentation.

Popliteal Vein: No evidence of thrombus. Normal compressibility,
respiratory phasicity and response to augmentation.

Calf Veins: Not well seen.

Superficial Great Saphenous Vein: No evidence of thrombus. Normal
compressibility and flow on color Doppler imaging.

Venous Reflux:  None.

Other Findings:  None.
IMPRESSION: No evidence of deep venous thrombosis.

## 2016-01-04 ENCOUNTER — Ambulatory Visit: Payer: BLUE CROSS/BLUE SHIELD | Admitting: Rheumatology

## 2016-05-08 NOTE — Progress Notes (Deleted)
Office Visit Note  Patient: Renee Howard             Date of Birth: [birth year]           MRN: [id number]             PCP: No primary care provider on file. Referring: No ref. provider found Visit Date: 05/17/2016 Occupation: @GUAROCC @    Subjective:  No chief complaint on file.   History of Present Illness: Renee Howard is a 41 y.o. female ***   Activities of Daily Living:  Patient reports morning stiffness for *** {minute/hour:19697}.   Patient {ACTIONS;DENIES/REPORTS:21021675::"Denies"} nocturnal pain.  Difficulty dressing/grooming: {ACTIONS;DENIES/REPORTS:21021675::"Denies"} Difficulty climbing stairs: {ACTIONS;DENIES/REPORTS:21021675::"Denies"} Difficulty getting out of chair: {ACTIONS;DENIES/REPORTS:21021675::"Denies"} Difficulty using hands for taps, buttons, cutlery, and/or writing: {ACTIONS;DENIES/REPORTS:21021675::"Denies"}   No Rheumatology ROS completed.   PMFS History:  Patient Active Problem List   Diagnosis Date Noted  . Numbness 09/03/2014  . Urinary frequency 09/03/2014  . Gait disturbance 09/03/2014  . Arm pain, left 09/03/2014  . Carpal tunnel syndrome 09/03/2014  . Cervical radiculitis 09/03/2014  . Rheumatoid arthritis involving multiple sites with positive rheumatoid factor (HCC) 09/03/2014    Past Medical History:  Diagnosis Date  . Arthritis   . DVT (deep venous thrombosis)   . No pertinent past medical history   . PONV (postoperative nausea and vomiting)     No family history on file. Past Surgical History:  Procedure Laterality Date  . CESAREAN SECTION    . HERNIA REPAIR     Social History   Social History Narrative  . No narrative on file     Objective: Vital Signs: There were no vitals taken for this visit.   Physical Exam   Musculoskeletal Exam: ***  CDAI Exam: No CDAI exam completed.    Investigation: Findings:    Labs from Aug 07, 2012 shows CBC with diff is normal except for anemia, which will be addressed by  the PCP, lupus anticoagulant negative, urinalysis is negative, beta-2 glycoprotein is positive with increased IgM at 49, anticardiolipin IgM is elevated at 24, sed rate is elevated at 20, CMP is normal, hep panel is negative, ANA is negative, CCP is negative, G6PD is normal, magnesium is normal, angiotensin 1 converting enzyme is normal.   12/02/2015 X-ray of bilateral hands 2 views today showed bilateral radiocarpal joint space narrowing, bilateral PIP and DIP narrowing without any erosive changes.  Bilateral feet x-rays show bilateral mild calcaneal spur and mild PIP, DIP narrowing.  No MTP or erosive changes were noted      Imaging: No results found.  Speciality Comments: No specialty comments available.    Procedures:  No procedures performed Allergies: Patient has no known allergies.   Assessment / Plan:     Visit Diagnoses: Rheumatoid arthritis involving multiple sites with positive rheumatoid factor (HCC) - Positive RF, positive ANA  High risk medication use - 10/2015 normal Plaquenil eye exam   Pain in joint, multiple sites  Primary osteoarthritis of both hands  Primary osteoarthritis of both feet - With calcaneal spurs    Orders: No orders of the defined types were placed in this encounter.  No orders of the defined types were placed in this encounter.   Face-to-face time spent with patient was *** minutes. 50% of time was spent in counseling and coordination of care.  Follow-Up Instructions: No Follow-up on file.   11/2015, MD  Note - This record has been created using Pollyann Savoy.  Chart  creation errors have been sought, but may not always  have been located. Such creation errors do not reflect on  the standard of medical care.

## 2016-05-11 ENCOUNTER — Telehealth: Payer: Self-pay | Admitting: Rheumatology

## 2016-05-11 ENCOUNTER — Other Ambulatory Visit: Payer: Self-pay | Admitting: *Deleted

## 2016-05-11 DIAGNOSIS — Z79899 Other long term (current) drug therapy: Secondary | ICD-10-CM

## 2016-05-11 NOTE — Telephone Encounter (Signed)
Patient called and is wanting a copy of her standing order for labs faxed to her at [phone].  ZM#[phone].  Thank you.

## 2016-05-11 NOTE — Telephone Encounter (Signed)
Patient advised lab orders faxed.

## 2016-05-17 ENCOUNTER — Ambulatory Visit: Payer: BLUE CROSS/BLUE SHIELD | Admitting: Rheumatology

## 2016-06-18 DIAGNOSIS — R7989 Other specified abnormal findings of blood chemistry: Secondary | ICD-10-CM

## 2016-06-18 DIAGNOSIS — R760 Raised antibody titer: Secondary | ICD-10-CM

## 2016-06-18 HISTORY — DX: Other specified abnormal findings of blood chemistry: R79.89

## 2016-06-18 HISTORY — DX: Raised antibody titer: R76.0

## 2016-06-18 NOTE — Progress Notes (Signed)
Office Visit Note  Patient: Renee Howard             Date of Birth: Apr 28, 1975           MRN: [id number]             PCP: Jarrett Soho, PA-C Referring: Jarrett Soho, PA-C Visit Date: 06/27/2016 Occupation: @GUAROCC @    Subjective:  Pain in hands and knees.   History of Present Illness: Renee Howard is a 41 y.o. female with history of sero positive rheumatoid arthritis. She states she's been doing fairly well on Plaquenil. She continues to have some discomfort in her hands and knees especially with the weather change or around her periods. She has not noticed any joint swelling she was also started on Lexapro which has helped her mood and also joint pain to some extent.   Activities of Daily Living:  Patient reports morning stiffness for 0 minute.   Patient Denies nocturnal pain.  Difficulty dressing/grooming: Denies Difficulty climbing stairs: Reports Difficulty getting out of chair: Reports Difficulty using hands for taps, buttons, cutlery, and/or writing: Denies   Review of Systems  Constitutional: Negative for fatigue, night sweats, weight gain, weight loss and weakness.  HENT: Negative for mouth sores, trouble swallowing, trouble swallowing, mouth dryness and nose dryness.   Eyes: Negative for pain, redness, visual disturbance and dryness.  Respiratory: Negative for cough, shortness of breath and difficulty breathing.   Cardiovascular: Negative for chest pain, palpitations, hypertension, irregular heartbeat and swelling in legs/feet.  Gastrointestinal: Negative for blood in stool, constipation and diarrhea.  Endocrine: Negative for increased urination.  Genitourinary: Negative for vaginal dryness.  Musculoskeletal: Positive for arthralgias and joint pain. Negative for joint swelling, myalgias, muscle weakness, morning stiffness, muscle tenderness and myalgias.  Skin: Negative for color change, rash, hair loss, skin tightness, ulcers and sensitivity to sunlight.    Allergic/Immunologic: Negative for susceptible to infections.  Neurological: Negative for dizziness, memory loss and night sweats.  Hematological: Negative for swollen glands.  Psychiatric/Behavioral: Negative for depressed mood and sleep disturbance. The patient is nervous/anxious.     PMFS History:  Patient Active Problem List   Diagnosis Date Noted  . Anticardiolipin antibody positive 06/18/2016  . Elevated beta-2  06/18/2016  . Numbness 09/03/2014  . Urinary frequency 09/03/2014  . Gait disturbance 09/03/2014  . Arm pain, left 09/03/2014  . Carpal tunnel syndrome 09/03/2014  . Cervical radiculitis 09/03/2014  . Rheumatoid arthritis involving multiple sites with positive rheumatoid factor (HCC) 09/03/2014    Past Medical History:  Diagnosis Date  . Arthritis   . DVT (deep venous thrombosis) (HCC)   . No pertinent past medical history   . PONV (postoperative nausea and vomiting)     No family history on file. Past Surgical History:  Procedure Laterality Date  . CESAREAN SECTION    . HERNIA REPAIR     Social History   Social History Narrative  . No narrative on file     Objective: Vital Signs: BP 128/80   Pulse 78   Resp 16   Ht 5\' 10"  (1.778 m)   Wt (!) 340 lb (154.2 kg)   LMP 06/03/2016   BMI 48.78 kg/m    Physical Exam  Constitutional: She is oriented to person, place, and time. She appears well-developed and well-nourished.  HENT:  Head: Normocephalic and atraumatic.  Eyes: Conjunctivae and EOM are normal.  Neck: Normal range of motion.  Cardiovascular: Normal rate, regular rhythm, normal heart sounds and  intact distal pulses.   Pulmonary/Chest: Effort normal and breath sounds normal.  Abdominal: Soft. Bowel sounds are normal.  Lymphadenopathy:    She has no cervical adenopathy.  Neurological: She is alert and oriented to person, place, and time.  Skin: Skin is warm and dry. Capillary refill takes less than 2 seconds.  Psychiatric: She has a normal  mood and affect. Her behavior is normal.  Nursing note and vitals reviewed.    Musculoskeletal Exam: C-spine and thoracic lumbar spine good range of motion. Shoulder joints although joints wrist joint MCPs PIPs DIPs were good range of motion with no synovitis. Hip joints knee joints ankles MTPs PIPs DIPs are good range of motion with no synovitis on examination today. She has some thickening of the DIP joints some consistent with early osteoarthritis of her hands.  CDAI Exam: CDAI Homunculus Exam:   Joint Counts:  CDAI Tender Joint count: 0 CDAI Swollen Joint count: 0  Global Assessments:  Patient Global Assessment: 2 Provider Global Assessment: 1  CDAI Calculated Score: 3    Investigation: Findings:  March 2014; C-reactive protein was 10.8, normal being 4.9; rheumatoid factor was 17.7, which was slightly elevated; her ANA was positive, but no titer was given and ENA was negative.    Labs from Aug 07, 2012 shows CBC with diff is normal except for anemia, which will be addressed by the PCP, lupus anticoagulant negative, urinalysis is negative, beta-2 glycoprotein is positive with increased IgM at 49, anticardiolipin IgM is elevated at 24, sed rate is elevated at 20, CMP is normal, hep panel is negative, ANA is negative, CCP is negative, G6PD is normal, magnesium is normal, angiotensin 1 converting enzyme is normal.   Normal PLQ eye exam 10/2015   10/12/2015 X-ray of bilateral hands 2 views today showed bilateral radiocarpal joint space narrowing, bilateral PIP and DIP narrowing without any erosive changes.  Bilateral feet x-rays show bilateral mild calcaneal spur and mild PIP, DIP narrowing.  No MTP or erosive changes were noted.  There was no comparison films available today.  Her RAPID-3 score was 2.07 which was moderate severity although she did not have any synovitis.  She does have discomfort from osteoarthritis.   11/2015:  CBC was normal.  Comprehensive metabolic panel was  normal.     Imaging: No results found.  Speciality Comments: No specialty comments available.    Procedures:  No procedures performed Allergies: Patient has no known allergies.   Assessment / Plan:     Visit Diagnoses: Rheumatoid arthritis involving multiple sites with positive rheumatoid factor (HCC) - Positive RF, positive ANA. Her symptoms seems to be quite well-controlled on Plaquenil she had no synovitis on examination today she complains of some arthralgias in her hands she has some early changes of osteoarthritis in her hands. We offered obtaining x-rays of her hands today but she would like to wait until next visit.  High risk medication use - plaquenil 200mg  po bid her labs have been faxed by her PCP. She had labs in March. Her eye exam has been normal. Her labs will be due every 5 months.  History of depression: Better on Lexapro, which has helped her arthralgias as well.  Gait disturbance  Carpal tunnel syndrome of left wrist: Mild symptoms  Anticardiolipin antibody positive: She does take aspirin on regular basis.  Elevated beta-2 : She is on aspirin  Cervical radiculitis: Symptoms are tolerable  Class 3 severe obesity due to excess calories without serious comorbidity with body mass  index (BMI) of 45.0 to 49.9 in adult [facility]): Weight loss diet and exercise was discussed.   Orders: No orders of the defined types were placed in this encounter.  Meds ordered this encounter  Medications  . hydroxychloroquine (PLAQUENIL) 200 MG tablet    Sig: Take 1 tablet (200 mg total) by mouth 2 (two) times daily.    Dispense:  180 tablet    Refill:  1    Face-to-face time spent with patient was 30 minutes. 50% of time was spent in counseling and coordination of care.  Follow-Up Instructions: Return in about 6 months (around 12/27/2016) for Rheumatoid arthritis.   Pollyann Savoy, MD  Note - This record has been created using Animal nutritionist.  Chart creation errors  have been sought, but may not always  have been located. Such creation errors do not reflect on  the standard of medical care.

## 2016-06-27 ENCOUNTER — Encounter: Payer: Self-pay | Admitting: Rheumatology

## 2016-06-27 ENCOUNTER — Ambulatory Visit: Payer: BLUE CROSS/BLUE SHIELD | Admitting: Rheumatology

## 2016-06-27 VITALS — BP 128/80 | HR 78 | Resp 16 | Ht 70.0 in | Wt 340.0 lb

## 2016-06-27 DIAGNOSIS — R269 Unspecified abnormalities of gait and mobility: Secondary | ICD-10-CM

## 2016-06-27 DIAGNOSIS — R7989 Other specified abnormal findings of blood chemistry: Secondary | ICD-10-CM

## 2016-06-27 DIAGNOSIS — R768 Other specified abnormal immunological findings in serum: Secondary | ICD-10-CM | POA: Diagnosis not present

## 2016-06-27 DIAGNOSIS — M0579 Rheumatoid arthritis with rheumatoid factor of multiple sites without organ or systems involvement: Secondary | ICD-10-CM | POA: Diagnosis not present

## 2016-06-27 DIAGNOSIS — Z79899 Other long term (current) drug therapy: Secondary | ICD-10-CM

## 2016-06-27 DIAGNOSIS — M5412 Radiculopathy, cervical region: Secondary | ICD-10-CM

## 2016-06-27 DIAGNOSIS — G5602 Carpal tunnel syndrome, left upper limb: Secondary | ICD-10-CM | POA: Diagnosis not present

## 2016-06-27 DIAGNOSIS — R760 Raised antibody titer: Secondary | ICD-10-CM

## 2016-06-27 DIAGNOSIS — Z6841 Body Mass Index (BMI) 40.0 and over, adult: Secondary | ICD-10-CM

## 2016-06-27 MED ORDER — HYDROXYCHLOROQUINE SULFATE 200 MG PO TABS: 200.0000 mg | ORAL_TABLET | Freq: Two times a day (BID) | ORAL | 1 refills | Status: DC

## 2016-06-27 NOTE — Progress Notes (Signed)
Rheumatology Medication Review by a Pharmacist Does the patient feel that his/her medications are working for him/her?  Yes Has the patient been experiencing any side effects to the medications prescribed?  No Does the patient have any problems obtaining medications?  No  Issues to address at subsequent visits: None   Pharmacist comments:  Renee Howard is a pleasant 41 yo F who presents for follow up of rheumatoid arthritis.  She is currently taking hydroxychloroquine 200 mg BID.  Most recent standing labs on file are from 11/29/15.  Patient reports she had labs drawn in March 2018.  Labs were faxed to our office.  CMP and CBC were normal.  Patient will be due for standing labs again in August 2018.    Most recent hydroxychloroquine eye exam was on 10/13/15 which was normal.  Patient reports she also had eye exam in March 2018.  I called [facility] and requested they send the results of the eye exam.  Patient denies any questions or concerns regarding her medications at this time.   Lilla Shook, Pharm.D., BCPS, CPP Clinical Pharmacist Pager: [phone] Phone: [phone] 06/27/2016 10:02 AM

## 2016-06-27 NOTE — Patient Instructions (Signed)
Standing Labs We placed an order today for your standing lab work.    Please come back and get your standing labs in August 2018 and every 5 months.    We have open lab Monday through Friday from 8:30-11:30 AM and 1:30-4 PM at the office of Dr. Arbutus Ped, PA.   The office is located at [address], [place], [place] [address] No appointment is necessary.   Labs are drawn by [facility].  You may receive a bill from [facility] for your lab work.

## 2016-06-28 ENCOUNTER — Encounter: Payer: Self-pay | Admitting: *Deleted

## 2016-06-28 DIAGNOSIS — Z79899 Other long term (current) drug therapy: Secondary | ICD-10-CM | POA: Insufficient documentation

## 2016-09-10 ENCOUNTER — Other Ambulatory Visit: Payer: Self-pay | Admitting: Rheumatology

## 2016-09-11 NOTE — Telephone Encounter (Signed)
Last Visit: 06/27/16 Next Visit: 12/27/16 Labs: 05/14/16 WNL PLQ Eye Exam: 04/18/16 WNL   Okay to refill per Dr. Corliss Skains

## 2016-12-17 NOTE — Progress Notes (Deleted)
Office Visit Note  Patient: Renee Howard             Date of Birth: 1975/03/23           MRN: [id number]             PCP: Jarrett Soho, PA-C Referring: Jarrett Soho, PA-C Visit Date: 12/27/2016 Occupation: @GUAROCC @    Subjective:  No chief complaint on file.   History of Present Illness: Renee Howard is a 41 y.o. female ***   Activities of Daily Living:  Patient reports morning stiffness for *** {minute/hour:19697}.   Patient {ACTIONS;DENIES/REPORTS:21021675::"Denies"} nocturnal pain.  Difficulty dressing/grooming: {ACTIONS;DENIES/REPORTS:21021675::"Denies"} Difficulty climbing stairs: {ACTIONS;DENIES/REPORTS:21021675::"Denies"} Difficulty getting out of chair: {ACTIONS;DENIES/REPORTS:21021675::"Denies"} Difficulty using hands for taps, buttons, cutlery, and/or writing: {ACTIONS;DENIES/REPORTS:21021675::"Denies"}   No Rheumatology ROS completed.   PMFS History:  Patient Active Problem List   Diagnosis Date Noted  . High risk medication use 06/28/2016  . Anticardiolipin antibody positive 06/18/2016  . Elevated beta-2  06/18/2016  . Numbness 09/03/2014  . Urinary frequency 09/03/2014  . Gait disturbance 09/03/2014  . Arm pain, left 09/03/2014  . Carpal tunnel syndrome 09/03/2014  . Cervical radiculitis 09/03/2014  . Rheumatoid arthritis involving multiple sites with positive rheumatoid factor (HCC) 09/03/2014    Past Medical History:  Diagnosis Date  . Arthritis   . DVT (deep venous thrombosis) (HCC)   . No pertinent past medical history   . PONV (postoperative nausea and vomiting)     No family history on file. Past Surgical History:  Procedure Laterality Date  . CESAREAN SECTION    . HERNIA REPAIR     Social History   Social History Narrative  . No narrative on file     Objective: Vital Signs: There were no vitals taken for this visit.   Physical Exam   Musculoskeletal Exam: ***  CDAI Exam: No CDAI exam completed.     Investigation: No additional findings.PLQ eye exam: 03/2016 CBC Latest Ref Rng & Units 08/16/2014 10/06/2013 06/10/2012  WBC 4.0 - 10.5 K/uL 8.0 9.5 -  Hemoglobin 12.0 - 15.0 g/dL 08/10/2012 60.7 37.1  Hematocrit 36.0 - 46.0 % 37.2 36.6 37.0  Platelets 150 - 400 K/uL 349 338 -   CMP Latest Ref Rng & Units 08/16/2014 10/06/2013 06/10/2012  Glucose 65 - 99 mg/dL 98 97 93  BUN 6 - 20 mg/dL 12 11 11   Creatinine 0.44 - 1.00 mg/dL 08/10/2012 6.94  Sodium 135 - 145 mmol/L 138 140 139  Potassium 3.5 - 5.1 mmol/L 3.9 3.9 3.7  Chloride 101 - 111 mmol/L 104 101 106  CO2 22 - 32 mmol/L 25 25 -  Calcium 8.9 - 10.3 mg/dL 8.54) 9.7 -  Total Protein 6.0 - 8.3 g/dL - 7.5 -  Total Bilirubin 0.3 - 1.2 mg/dL - 0.5 -  Alkaline Phos 39 - 117 U/L - 70 -  AST 0 - 37 U/L - 21 -  ALT 0 - 35 U/L - 20 -    Imaging: No results found.  Speciality Comments: No specialty comments available.    Procedures:  No procedures performed Allergies: Patient has no known allergies.   Assessment / Plan:     Visit Diagnoses: No diagnosis found.    Orders: No orders of the defined types were placed in this encounter.  No orders of the defined types were placed in this encounter.   Face-to-face time spent with patient was *** minutes. 50% of time was spent in counseling and coordination of  care.  Follow-Up Instructions: No Follow-up on file.   Ellen Henri, NT  Note - This record has been created using Animal nutritionist.  Chart creation errors have been sought, but may not always  have been located. Such creation errors do not reflect on  the standard of medical care.

## 2016-12-26 DIAGNOSIS — M19042 Primary osteoarthritis, left hand: Secondary | ICD-10-CM

## 2016-12-26 DIAGNOSIS — M19041 Primary osteoarthritis, right hand: Secondary | ICD-10-CM | POA: Insufficient documentation

## 2016-12-26 HISTORY — DX: Primary osteoarthritis, right hand: M19.041

## 2016-12-27 ENCOUNTER — Ambulatory Visit: Payer: BLUE CROSS/BLUE SHIELD | Admitting: Rheumatology

## 2017-01-05 NOTE — Progress Notes (Signed)
Office Visit Note  Patient: Renee Howard             Date of Birth: Feb 26, 1976           MRN: [id number]             PCP: Jarrett Soho, PA-C Referring: Jarrett Soho, PA-C Visit Date: 01/16/2017 Occupation: @GUAROCC @    Subjective:  Medication management   History of Present Illness: Renee Howard is a 41 y.o. female with history of sero positive rheumatoid arthritis. She states she's not having any joint pain or joint swelling currently. She's been tolerating Plaquenil well. She  Still has some discomfort from left carpal tunnel syndrome off-and-on.  Activities of Daily Living:  Patient reports morning stiffness for 0 minute.   Patient Denies nocturnal pain.  Difficulty dressing/grooming: Denies Difficulty climbing stairs: Reports Difficulty getting out of chair: Reports Difficulty using hands for taps, buttons, cutlery, and/or writing: Denies   Review of Systems  Constitutional: Positive for fatigue. Negative for night sweats, weight gain, weight loss and weakness.  HENT: Negative for mouth sores, trouble swallowing, trouble swallowing, mouth dryness and nose dryness.   Eyes: Positive for dryness. Negative for pain, redness and visual disturbance.  Respiratory: Negative for cough, shortness of breath and difficulty breathing.   Cardiovascular: Negative for chest pain, palpitations, hypertension, irregular heartbeat and swelling in legs/feet.  Gastrointestinal: Negative for blood in stool, constipation and diarrhea.  Endocrine: Negative for increased urination.  Genitourinary: Negative for vaginal dryness.  Musculoskeletal: Positive for arthralgias, joint pain and morning stiffness. Negative for joint swelling, myalgias, muscle weakness, muscle tenderness and myalgias.  Skin: Positive for rash. Negative for color change, hair loss, skin tightness, ulcers and sensitivity to sunlight.       Tenia corporis  Allergic/Immunologic: Negative for susceptible to infections.    Neurological: Negative for dizziness, memory loss and night sweats.  Hematological: Negative for swollen glands.  Psychiatric/Behavioral: Negative for depressed mood and sleep disturbance. The patient is not nervous/anxious.     PMFS History:  Patient Active Problem List   Diagnosis Date Noted  . Primary osteoarthritis of both hands 12/26/2016  . High risk medication use 06/28/2016  . Anticardiolipin antibody positive 06/18/2016  . Elevated beta-2  06/18/2016  . Numbness 09/03/2014  . Urinary frequency 09/03/2014  . Gait disturbance 09/03/2014  . Arm pain, left 09/03/2014  . Carpal tunnel syndrome 09/03/2014  . Cervical radiculitis 09/03/2014  . Rheumatoid arthritis involving multiple sites with positive rheumatoid factor (HCC) 09/03/2014    Past Medical History:  Diagnosis Date  . Arthritis   . DVT (deep venous thrombosis) (HCC)   . No pertinent past medical history   . PONV (postoperative nausea and vomiting)     History reviewed. No pertinent family history. Past Surgical History:  Procedure Laterality Date  . CESAREAN SECTION     2009  . HERNIA REPAIR     1978   Social History   Social History Narrative  . Not on file     Objective: Vital Signs: BP 128/80 (BP Location: Left Arm, Patient Position: Sitting, Cuff Size: Normal)   Pulse 72   Resp 19   Ht 5\' 11"  (1.803 m)   Wt (!) 344 lb (156 kg)   BMI 47.98 kg/m    Physical Exam  Constitutional: She is oriented to person, place, and time. She appears well-developed and well-nourished.  HENT:  Head: Normocephalic and atraumatic.  Eyes: Conjunctivae and EOM are normal.  Neck: Normal  range of motion.  Cardiovascular: Normal rate, regular rhythm, normal heart sounds and intact distal pulses.  Pulmonary/Chest: Effort normal and breath sounds normal.  Abdominal: Soft. Bowel sounds are normal.  Lymphadenopathy:    She has no cervical adenopathy.  Neurological: She is alert and oriented to person, place, and  time.  Skin: Skin is warm and dry. Capillary refill takes less than 2 seconds.  Erythematous rash  Psychiatric: She has a normal mood and affect. Her behavior is normal.  Nursing note and vitals reviewed.    Musculoskeletal Exam: C-spine and thoracic lumbar spine good range of motion. Shoulder joints elbow joints wrist joint MCPs PIPs DIPs with good range of motion. She had no synovitis on examination. Hip joints knee joints ankles MTPs PIPs with good range of motion with no synovitis.  CDAI Exam: CDAI Homunculus Exam:   Joint Counts:  CDAI Tender Joint count: 0 CDAI Swollen Joint count: 0  Global Assessments:  Patient Global Assessment: 2 Provider Global Assessment: 2  CDAI Calculated Score: 4    Investigation: No additional findings.PLQ eye exam: 04/2016 CBC Latest Ref Rng & Units 08/16/2014 10/06/2013 06/10/2012  WBC 4.0 - 10.5 K/uL 8.0 9.5 -  Hemoglobin 12.0 - 15.0 g/dL 08.6 76.1 95.0  Hematocrit 36.0 - 46.0 % 37.2 36.6 37.0  Platelets 150 - 400 K/uL 349 338 -   CMP Latest Ref Rng & Units 08/16/2014 10/06/2013 06/10/2012  Glucose 65 - 99 mg/dL 98 97 93  BUN 6 - 20 mg/dL 12 11 11   Creatinine 0.44 - 1.00 mg/dL 9.32 6.71  Sodium 135 - 145 mmol/L 138 140 139  Potassium 3.5 - 5.1 mmol/L 3.9 3.9 3.7  Chloride 101 - 111 mmol/L 104 101 106  CO2 22 - 32 mmol/L 25 25 -  Calcium 8.9 - 10.3 mg/dL 2.45) 9.7 -  Total Protein 6.0 - 8.3 g/dL - 7.5 -  Total Bilirubin 0.3 - 1.2 mg/dL - 0.5 -  Alkaline Phos 39 - 117 U/L - 70 -  AST 0 - 37 U/L - 21 -  ALT 0 - 35 U/L - 20 -    Imaging: No results found.  Speciality Comments: No specialty comments available.    Procedures:  No procedures performed Allergies: Patient has no known allergies.   Assessment / Plan:     Visit Diagnoses: Rheumatoid arthritis involving multiple sites with positive rheumatoid factor (HCC) - Positive RF, positive ANA.patient had no synovitis on examination. She denies any arthralgias today.  High risk  medication use - plaquenil 200mg  po bid eye exam: 04/2016 - Plan: CBC with Differential/Platelet, COMPLETE METABOLIC PANEL WITH GFR,and then in 5 months. CBC with Differential/Platelet, COMPLETE METABOLIC PANEL WITH GFR. Her next eye exam will be due in February.  Carpal tunnel syndrome of left wrist: She has intermittent discomfort.  Anticardiolipin antibody positive - She does take aspirin on regular basis. - Plan: ANA, C3 and C4, Anti-DNA antibody, double-stranded, Cardiolipin antibodies, IgG, IgM, IgA, Beta-2 glycoprotein antibodies  Elevated beta-2  - She is on aspirin  Primary osteoarthritis of both hands  History of depression - Better on Lexapro, which has helped her arthralgias as well.  Cervical radiculitis - Symptoms are tolerable  History of obesity : Weight loss diet and exercise was discussed.  Ringworm infection: Patient is under treatment currently   Orders: Orders Placed This Encounter  Procedures  . CBC with Differential/Platelet  . COMPLETE METABOLIC PANEL WITH GFR  . ANA  . C3 and C4  .  Anti-DNA antibody, double-stranded  . Cardiolipin antibodies, IgG, IgM, IgA  . Beta-2 glycoprotein antibodies  . CBC with Differential/Platelet  . COMPLETE METABOLIC PANEL WITH GFR   No orders of the defined types were placed in this encounter.   Face-to-face time spent with patient was 30 minutes. Greater than 50% of time was spent in counseling and coordination of care.  Follow-Up Instructions: Return in about 6 months (around 07/16/2017) for Rheumatoid arthritis.   Pollyann Savoy, MD  Note - This record has been created using Animal nutritionist.  Chart creation errors have been sought, but may not always  have been located. Such creation errors do not reflect on  the standard of medical care.

## 2017-01-16 ENCOUNTER — Ambulatory Visit (INDEPENDENT_AMBULATORY_CARE_PROVIDER_SITE_OTHER): Payer: 59 | Admitting: Rheumatology

## 2017-01-16 ENCOUNTER — Encounter: Payer: Self-pay | Admitting: Rheumatology

## 2017-01-16 VITALS — BP 128/80 | HR 72 | Resp 19 | Ht 71.0 in | Wt 344.0 lb

## 2017-01-16 DIAGNOSIS — Z8639 Personal history of other endocrine, nutritional and metabolic disease: Secondary | ICD-10-CM | POA: Diagnosis not present

## 2017-01-16 DIAGNOSIS — G5602 Carpal tunnel syndrome, left upper limb: Secondary | ICD-10-CM | POA: Diagnosis not present

## 2017-01-16 DIAGNOSIS — Z8659 Personal history of other mental and behavioral disorders: Secondary | ICD-10-CM | POA: Diagnosis not present

## 2017-01-16 DIAGNOSIS — R768 Other specified abnormal immunological findings in serum: Secondary | ICD-10-CM | POA: Diagnosis not present

## 2017-01-16 DIAGNOSIS — M5412 Radiculopathy, cervical region: Secondary | ICD-10-CM | POA: Diagnosis not present

## 2017-01-16 DIAGNOSIS — Z79899 Other long term (current) drug therapy: Secondary | ICD-10-CM | POA: Diagnosis not present

## 2017-01-16 DIAGNOSIS — R760 Raised antibody titer: Secondary | ICD-10-CM

## 2017-01-16 DIAGNOSIS — M19041 Primary osteoarthritis, right hand: Secondary | ICD-10-CM

## 2017-01-16 DIAGNOSIS — M0579 Rheumatoid arthritis with rheumatoid factor of multiple sites without organ or systems involvement: Secondary | ICD-10-CM | POA: Diagnosis not present

## 2017-01-16 DIAGNOSIS — M19042 Primary osteoarthritis, left hand: Secondary | ICD-10-CM | POA: Diagnosis not present

## 2017-01-16 DIAGNOSIS — R7989 Other specified abnormal findings of blood chemistry: Secondary | ICD-10-CM

## 2017-01-16 NOTE — Patient Instructions (Signed)
Standing Labs We placed an order today for your standing lab work.    Please come back and get your standing labs in April   We have open lab Monday through Friday from 8:30-11:30 AM and 1:30-4 PM at the office of Dr. Toshiko Kemler.   The office is located at [address], [address], [place], [place] [address] No appointment is necessary.   Labs are drawn by [facility].  You may receive a bill from [facility] for your lab work. If you have any questions regarding directions or hours of operation,  please call [phone].    

## 2017-01-17 NOTE — Progress Notes (Signed)
stable °

## 2017-01-21 LAB — COMPLETE METABOLIC PANEL WITH GFR
AG RATIO: 1.4 (calc) (ref 1.0–2.5)
ALBUMIN MSPROF: 4 g/dL (ref 3.6–5.1)
ALKALINE PHOSPHATASE (APISO): 68 U/L (ref 33–115)
ALT: 15 U/L (ref 6–29)
AST: 16 U/L (ref 10–30)
BILIRUBIN TOTAL: 0.4 mg/dL (ref 0.2–1.2)
BUN: 12 mg/dL (ref 7–25)
CHLORIDE: 104 mmol/L (ref 98–110)
CO2: 22 mmol/L (ref 20–32)
Calcium: 8.8 mg/dL (ref 8.6–10.2)
Creat: 0.77 mg/dL (ref 0.50–1.10)
GFR, EST AFRICAN AMERICAN: 112 mL/min/{1.73_m2} (ref >=60)
GFR, Est Non African American: 97 mL/min/{1.73_m2} (ref >=60)
Globulin: 2.8 g/dL (calc) (ref 1.9–3.7)
Glucose, Bld: 84 mg/dL (ref 65–99)
POTASSIUM: 4.3 mmol/L (ref 3.5–5.3)
Sodium: 136 mmol/L (ref 135–146)
TOTAL PROTEIN: 6.8 g/dL (ref 6.1–8.1)

## 2017-01-21 LAB — CBC WITH DIFFERENTIAL/PLATELET
BASOS ABS: 41 {cells}/uL (ref 0–200)
BASOS PCT: 0.5 %
EOS ABS: 131 {cells}/uL (ref 15–500)
Eosinophils Relative: 1.6 %
HCT: 36 % (ref 35.0–45.0)
Hemoglobin: 12 g/dL (ref 11.7–15.5)
Lymphs Abs: 1779 cells/uL (ref 850–3900)
MCH: 25.6 pg — AB (ref 27.0–33.0)
MCHC: 33.3 g/dL (ref 32.0–36.0)
MCV: 76.8 fL — AB (ref 80.0–100.0)
MONOS PCT: 8.5 %
MPV: 9.4 fL (ref 7.5–12.5)
NEUTROS PCT: 67.7 %
Neutro Abs: 5551 cells/uL (ref 1500–7800)
PLATELETS: 347 10*3/uL (ref 140–400)
RBC: 4.69 10*6/uL (ref 3.80–5.10)
RDW: 14.1 % (ref 11.0–15.0)
TOTAL LYMPHOCYTE: 21.7 %
WBC: 8.2 10*3/uL (ref 3.8–10.8)
WBCMIX: 697 {cells}/uL (ref 200–950)

## 2017-01-21 LAB — BETA-2 GLYCOPROTEIN ANTIBODIES
Beta-2 Glyco 1 IgM: <9 SMU (ref <=20)
Beta-2 Glyco I IgG: <9 SGU (ref <=20)

## 2017-01-21 LAB — C3 AND C4
C3 Complement: 180 mg/dL (ref 83–193)
C4 Complement: 21 mg/dL (ref 15–57)

## 2017-01-21 LAB — ANTI-DNA ANTIBODY, DOUBLE-STRANDED

## 2017-01-21 LAB — CARDIOLIPIN ANTIBODIES, IGG, IGM, IGA: Anticardiolipin IgM: <12 [MPL'U]

## 2017-01-21 LAB — ANA: ANA: NEGATIVE

## 2017-01-22 NOTE — Progress Notes (Signed)
Labs are stable.

## 2017-03-11 ENCOUNTER — Other Ambulatory Visit: Payer: Self-pay | Admitting: *Deleted

## 2017-03-11 MED ORDER — HYDROXYCHLOROQUINE SULFATE 200 MG PO TABS: 200.0000 mg | ORAL_TABLET | Freq: Two times a day (BID) | ORAL | 0 refills | Status: DC

## 2017-03-11 NOTE — Telephone Encounter (Signed)
Refill request received via fax  Last Visit: 01/16/17 Next Visit: 07/16/17 Labs: 01/16/17 Stable PLQ Eye Exam: 04/2016 WNL  Okay to refill per Dr. Corliss Skains

## 2017-07-02 NOTE — Progress Notes (Signed)
Office Visit Note  Patient: Renee Howard             Date of Birth: [birth year]           MRN: [id number]             PCP: Jarrett Soho, PA-C Referring: Jarrett Soho, PA-C Visit Date: 07/16/2017 Occupation: @GUAROCC @    Subjective:  Pain in feet.   History of Present Illness: Renee Howard is a 42 y.o. female with history of seropositive rheumatoid arthritis.  She states she has been having increased discomfort in her bilateral feet recently.  She denies any joint swelling.  She notices some edema over her ankles.  He continues to have some discomfort in her bilateral knee joints.  Activities of Daily Living:  Patient reports morning stiffness for 10 minutes.   Patient Denies nocturnal pain.  Difficulty dressing/grooming: Denies Difficulty climbing stairs: Reports Difficulty getting out of chair: Reports Difficulty using hands for taps, buttons, cutlery, and/or writing: Denies   Review of Systems  Constitutional: Positive for fatigue. Negative for night sweats, weight gain and weight loss.  HENT: Positive for mouth dryness. Negative for mouth sores, trouble swallowing, trouble swallowing and nose dryness.   Eyes: Positive for dryness. Negative for pain, redness and visual disturbance.  Respiratory: Negative for cough, shortness of breath and difficulty breathing.   Cardiovascular: Negative for chest pain, palpitations, hypertension, irregular heartbeat and swelling in legs/feet.  Gastrointestinal: Positive for heartburn. Negative for blood in stool, constipation and diarrhea.  Endocrine: Negative for increased urination.  Genitourinary: Negative for vaginal dryness.  Musculoskeletal: Positive for arthralgias, joint pain and morning stiffness. Negative for joint swelling, myalgias, muscle weakness, muscle tenderness and myalgias.  Skin: Positive for color change. Negative for rash, hair loss, skin tightness, ulcers and sensitivity to sunlight.  Allergic/Immunologic:  Negative for susceptible to infections.  Neurological: Negative for dizziness, memory loss, night sweats and weakness.  Hematological: Negative for swollen glands.  Psychiatric/Behavioral: Positive for depressed mood. Negative for sleep disturbance. The patient is not nervous/anxious.     PMFS History:  Patient Active Problem List   Diagnosis Date Noted  . Primary osteoarthritis of both hands 12/26/2016  . High risk medication use 06/28/2016  . Anticardiolipin antibody positive 06/18/2016  . Elevated beta-2  06/18/2016  . Numbness 09/03/2014  . Urinary frequency 09/03/2014  . Gait disturbance 09/03/2014  . Arm pain, left 09/03/2014  . Carpal tunnel syndrome 09/03/2014  . Cervical radiculitis 09/03/2014  . Rheumatoid arthritis involving multiple sites with positive rheumatoid factor (HCC) 09/03/2014    Past Medical History:  Diagnosis Date  . Arthritis   . DVT (deep venous thrombosis) (HCC)   . No pertinent past medical history   . PONV (postoperative nausea and vomiting)     History reviewed. No pertinent family history. Past Surgical History:  Procedure Laterality Date  . CESAREAN SECTION     2009  . HERNIA REPAIR     1978   Social History   Social History Narrative  . Not on file     Objective: Vital Signs: BP 128/76 (BP Location: Left Arm, Patient Position: Sitting, Cuff Size: Large)   Pulse 74   Resp 17   Ht 5\' 10"  (1.778 m)   BMI 49.36 kg/m    Physical Exam  Constitutional: She is oriented to person, place, and time. She appears well-developed and well-nourished.  HENT:  Head: Normocephalic and atraumatic.  Eyes: Conjunctivae and EOM are normal.  Neck: Normal  range of motion.  Cardiovascular: Normal rate, regular rhythm, normal heart sounds and intact distal pulses.  Pulmonary/Chest: Effort normal and breath sounds normal.  Abdominal: Soft. Bowel sounds are normal.  Lymphadenopathy:    She has no cervical adenopathy.  Neurological: She is alert and  oriented to person, place, and time.  Skin: Skin is warm and dry. Capillary refill takes less than 2 seconds.  Psychiatric: She has a normal mood and affect. Her behavior is normal.  Nursing note and vitals reviewed.    Musculoskeletal Exam: C-spine thoracic lumbar spine good range of motion.  Shoulder joints elbow joints wrist joint MCPs PIPs DIPs were in good range of motion.  She has some DIP thickening in her hands.  Hip joints knee joints ankles MTPs PIPs were in good range of motion with no synovitis.   CDAI Exam: CDAI Homunculus Exam:   Joint Counts:  CDAI Tender Joint count: 0 CDAI Swollen Joint count: 0  Global Assessments:  Patient Global Assessment: 3 Provider Global Assessment: 3  CDAI Calculated Score: 6    Investigation: No additional findings.PLQ eye exam: 07/02/2017 CBC Latest Ref Rng & Units 01/16/2017 08/16/2014 10/06/2013  WBC 3.8 - 10.8 Thousand/uL 8.2 8.0 9.5  Hemoglobin 11.7 - 15.5 g/dL 40.9 81.1 91.4  Hematocrit 35.0 - 45.0 % 36.0 37.2 36.6  Platelets 140 - 400 Thousand/uL 347 349 338   CMP Latest Ref Rng & Units 01/16/2017 08/16/2014 10/06/2013  Glucose 65 - 99 mg/dL 84 98 97  BUN 7 - 25 mg/dL 12 12 11   Creatinine 0.50 - 1.10 mg/dL 7.82 9.56  Sodium 135 - 146 mmol/L 136 138 140  Potassium 3.5 - 5.3 mmol/L 4.3 3.9 3.9  Chloride 98 - 110 mmol/L 104 104 101  CO2 20 - 32 mmol/L 22 25 25   Calcium 8.6 - 10.2 mg/dL 8.8 2.13) 9.7  Total Protein 6.1 - 8.1 g/dL 6.8 - 7.5  Total Bilirubin 0.2 - 1.2 mg/dL 0.4 - 0.5  Alkaline Phos 39 - 117 U/L - - 70  AST 10 - 30 U/L 16 - 21  ALT 6 - 29 U/L 15 - 29 January 2017 ANA negative, C3-C4 normal, dsDNA negative, anticardiolipin negative, beta-2 negative  Imaging: 0.8(M Extrem Low [employer]at [employer]  Result Date: 07/16/2017 Ultrasound examination of bilateral hands was performed per EULAR recommendations. Using 12 MHz transducer, grayscale and power Doppler bilateral first, second, fourth, and fifth MTP joints and bilateral  plantar fascia both dorsal and plantar aspects were evaluated to look for synovitis or tenosynovitis. The findings were there was no synovitis or tenosynovitis on ultrasound examination. There was no plantar fascitis. Impression: Ultrasound examination did not show any synovitis or plantar fasciitis.   Speciality Comments: PLQ eye exam: 07/02/2017 Normal. [facility]. Follow up in 6 months.    Procedures:  No procedures performed Allergies: Patient has no known allergies.   Assessment / Plan:     Visit Diagnoses: Rheumatoid arthritis involving multiple sites with positive rheumatoid factor (HCC) - Positive RF, positive ANA.  Patient has no synovitis on examination.  Although she has been having increased pain in her bilateral feet.  She is on examination of bilateral feet was performed today which did not show any synovitis of plantar fasciitis.  We decided not to proceed with x-rays as patient is not using any contraceptive.  High risk medication use - PLQ 200 mg p.o. twice daily.  Her labs have been stable.  Anticardiolipin antibody positive -in the past.  Her repeat labs in November 2018 were negative.  Elevated beta-2  -was negative in November 2018.  Primary osteoarthritis of both hands-joint protection muscle strengthening discussed.  History of obesity-weight loss diet and exercise was discussed.  History of depression - Better on Lexapro, which has helped her arthralgias as well.   Association of heart disease with rheumatoid arthritis was discussed. Need to monitor blood pressure, cholesterol, and to exercise 30-60 minutes on daily basis was discussed. Poor dental hygiene can be a predisposing factor for rheumatoid arthritis. Good dental hygiene was discussed. Orders: Orders Placed This Encounter  Procedures  . [place] Extrem Low [employer]at [employer]  . CBC with Differential/Platelet  . COMPLETE METABOLIC PANEL WITH GFR   No orders of the defined types were placed in this  encounter.   Face-to-face time spent with patient was 30 minutes. >50% of time was spent in counseling and coordination of care.  Follow-Up Instructions: Return in about 5 months (around 12/16/2017) for Rheumatoid arthritis.   Pollyann Savoy, MD  Note - This record has been created using Animal nutritionist.  Chart creation errors have been sought, but may not always  have been located. Such creation errors do not reflect on  the standard of medical care.

## 2017-07-08 ENCOUNTER — Other Ambulatory Visit: Payer: Self-pay | Admitting: Rheumatology

## 2017-07-08 NOTE — Telephone Encounter (Signed)
Last Visit: 01/16/17 Next Visit: 07/16/17 Labs: 01/16/17 Stable PLQ Eye Exam:  07/02/2017 Normal.

## 2017-07-16 ENCOUNTER — Encounter: Payer: Self-pay | Admitting: Rheumatology

## 2017-07-16 ENCOUNTER — Ambulatory Visit: Payer: 59 | Admitting: Rheumatology

## 2017-07-16 ENCOUNTER — Ambulatory Visit (INDEPENDENT_AMBULATORY_CARE_PROVIDER_SITE_OTHER): Payer: Self-pay

## 2017-07-16 VITALS — BP 128/76 | HR 74 | Resp 17 | Ht 70.0 in

## 2017-07-16 DIAGNOSIS — M19042 Primary osteoarthritis, left hand: Secondary | ICD-10-CM

## 2017-07-16 DIAGNOSIS — M19041 Primary osteoarthritis, right hand: Secondary | ICD-10-CM

## 2017-07-16 DIAGNOSIS — R7989 Other specified abnormal findings of blood chemistry: Secondary | ICD-10-CM

## 2017-07-16 DIAGNOSIS — M79672 Pain in left foot: Secondary | ICD-10-CM | POA: Diagnosis not present

## 2017-07-16 DIAGNOSIS — Z8659 Personal history of other mental and behavioral disorders: Secondary | ICD-10-CM | POA: Diagnosis not present

## 2017-07-16 DIAGNOSIS — R760 Raised antibody titer: Secondary | ICD-10-CM | POA: Diagnosis not present

## 2017-07-16 DIAGNOSIS — Z8639 Personal history of other endocrine, nutritional and metabolic disease: Secondary | ICD-10-CM

## 2017-07-16 DIAGNOSIS — Z79899 Other long term (current) drug therapy: Secondary | ICD-10-CM | POA: Diagnosis not present

## 2017-07-16 DIAGNOSIS — M0579 Rheumatoid arthritis with rheumatoid factor of multiple sites without organ or systems involvement: Secondary | ICD-10-CM

## 2017-07-16 DIAGNOSIS — M79671 Pain in right foot: Secondary | ICD-10-CM

## 2017-07-16 LAB — CBC WITH DIFFERENTIAL/PLATELET
BASOS ABS: 40 {cells}/uL (ref 0–200)
Basophils Relative: 0.5 %
EOS PCT: 2.3 %
Eosinophils Absolute: 184 cells/uL (ref 15–500)
HEMATOCRIT: 36.9 % (ref 35.0–45.0)
Hemoglobin: 12.5 g/dL (ref 11.7–15.5)
Lymphs Abs: 2072 cells/uL (ref 850–3900)
MCH: 26.1 pg — ABNORMAL LOW (ref 27.0–33.0)
MCHC: 33.9 g/dL (ref 32.0–36.0)
MCV: 77 fL — ABNORMAL LOW (ref 80.0–100.0)
MPV: 9.7 fL (ref 7.5–12.5)
Monocytes Relative: 8.1 %
NEUTROS PCT: 63.2 %
Neutro Abs: 5056 cells/uL (ref 1500–7800)
Platelets: 357 10*3/uL (ref 140–400)
RBC: 4.79 10*6/uL (ref 3.80–5.10)
RDW: 14.2 % (ref 11.0–15.0)
Total Lymphocyte: 25.9 %
WBC mixed population: 648 cells/uL (ref 200–950)
WBC: 8 10*3/uL (ref 3.8–10.8)

## 2017-07-16 LAB — COMPLETE METABOLIC PANEL WITH GFR
AG RATIO: 1.6 (calc) (ref 1.0–2.5)
ALKALINE PHOSPHATASE (APISO): 73 U/L (ref 33–115)
ALT: 21 U/L (ref 6–29)
AST: 19 U/L (ref 10–30)
Albumin: 4.2 g/dL (ref 3.6–5.1)
BUN: 14 mg/dL (ref 7–25)
CHLORIDE: 102 mmol/L (ref 98–110)
CO2: 28 mmol/L (ref 20–32)
CREATININE: 0.9 mg/dL (ref 0.50–1.10)
Calcium: 9.2 mg/dL (ref 8.6–10.2)
GFR, Est African American: 92 mL/min/{1.73_m2} (ref >=60)
GFR, Est Non African American: 79 mL/min/{1.73_m2} (ref >=60)
GLOBULIN: 2.7 g/dL (ref 1.9–3.7)
Glucose, Bld: 90 mg/dL (ref 65–99)
POTASSIUM: 4.3 mmol/L (ref 3.5–5.3)
SODIUM: 139 mmol/L (ref 135–146)
Total Bilirubin: 0.5 mg/dL (ref 0.2–1.2)
Total Protein: 6.9 g/dL (ref 6.1–8.1)

## 2017-07-16 NOTE — Patient Instructions (Signed)
Association of heart disease with rheumatoid arthritis was discussed. Need to monitor blood pressure, cholesterol, and to exercise 30-60 minutes on daily basis was discussed. Poor dental hygiene can be a predisposing factor for rheumatoid arthritis. Good dental hygiene was discussed. 

## 2017-12-02 NOTE — Progress Notes (Signed)
Office Visit Note  Patient: Renee Howard             Date of Birth: [birth year]           MRN: [id number]             PCP: Jarrett Soho, PA-C Referring: Jarrett Soho, PA-C Visit Date: 12/16/2017 Occupation: @GUAROCC @  Subjective:  Fatigue   History of Present Illness: Renee Howard is a 42 y.o. female with history of seropositive rheumatoid arthritis and osteoarthritis.  She is on PLQ 200 mg by mouth BID.  She denies any recent flares of rheumatoid arthritis.  She has any joint pain or joint swelling at this time.  She reports occasional discomfort in bilateral knee joints but denies any joint swelling.  She states that she has had some increased lower back stiffness especially if she is sitting for prolonged periods of time.  She denies any radiation of pain from her lower back.  Denies any changes in activity.  She reports that her fatigue has been worsening recently.  She states that some days she feels as though she could sleep all day.  She denies any insomnia.  She states that she did follow-up with her gynecologist who did a TSH which was normal per patient.  She reports that her insurance will not cover sleep study.   Activities of Daily Living:  Patient reports morning stiffness for several hours.   Patient Reports nocturnal pain.  Difficulty dressing/grooming: Reports Difficulty climbing stairs: Reports Difficulty getting out of chair: Reports Difficulty using hands for taps, buttons, cutlery, and/or writing: Denies  Review of Systems  Constitutional: Positive for fatigue.  HENT: Positive for mouth dryness. Negative for mouth sores, trouble swallowing, trouble swallowing and nose dryness.   Eyes: Positive for dryness. Negative for pain and visual disturbance.  Respiratory: Negative for cough, hemoptysis, shortness of breath and difficulty breathing.   Cardiovascular: Negative for chest pain, palpitations, hypertension and swelling in legs/feet.  Gastrointestinal:  Negative for abdominal pain, blood in stool, constipation, diarrhea, nausea and vomiting.  Endocrine: Negative for increased urination.  Genitourinary: Negative for painful urination, nocturia and pelvic pain.  Musculoskeletal: Positive for arthralgias, joint pain and morning stiffness. Negative for joint swelling, myalgias, muscle weakness, muscle tenderness and myalgias.  Skin: Negative for color change, pallor, rash, hair loss, nodules/bumps, skin tightness, ulcers and sensitivity to sunlight.  Allergic/Immunologic: Negative for susceptible to infections.  Neurological: Negative for dizziness, light-headedness, numbness and memory loss.  Hematological: Negative for swollen glands.  Psychiatric/Behavioral: Negative for depressed mood, confusion and sleep disturbance. The patient is not nervous/anxious.     PMFS History:  Patient Active Problem List   Diagnosis Date Noted  . Primary osteoarthritis of both hands 12/26/2016  . High risk medication use 06/28/2016  . Anticardiolipin antibody positive 06/18/2016  . Elevated beta-2  06/18/2016  . Numbness 09/03/2014  . Urinary frequency 09/03/2014  . Gait disturbance 09/03/2014  . Arm pain, left 09/03/2014  . Carpal tunnel syndrome 09/03/2014  . Cervical radiculitis 09/03/2014  . Rheumatoid arthritis involving multiple sites with positive rheumatoid factor (HCC) 09/03/2014    Past Medical History:  Diagnosis Date  . Arthritis   . DVT (deep venous thrombosis) (HCC)   . No pertinent past medical history   . PONV (postoperative nausea and vomiting)     History reviewed. No pertinent family history. Past Surgical History:  Procedure Laterality Date  . CESAREAN SECTION     2009  . HERNIA REPAIR  68   Social History   Social History Narrative  . Not on file    Objective: Vital Signs: BP (!) 152/93 (BP Location: Left Wrist, Patient Position: Sitting, Cuff Size: Normal)   Pulse 72   Resp 16   Ht 5\' 10"  (1.778 m)   Wt (!)  357 lb 6.4 oz (162.1 kg)   BMI 51.28 kg/m    Physical Exam  Constitutional: She is oriented to person, place, and time. She appears well-developed and well-nourished.  HENT:  Head: Normocephalic and atraumatic.  Eyes: Conjunctivae and EOM are normal.  Neck: Normal range of motion.  Cardiovascular: Normal rate, regular rhythm, normal heart sounds and intact distal pulses.  Pulmonary/Chest: Effort normal and breath sounds normal.  Abdominal: Soft. Bowel sounds are normal.  Lymphadenopathy:    She has no cervical adenopathy.  Neurological: She is alert and oriented to person, place, and time.  Skin: Skin is warm and dry. Capillary refill takes less than 2 seconds.  Psychiatric: She has a normal mood and affect. Her behavior is normal.  Nursing note and vitals reviewed.    Musculoskeletal Exam: C-spine, thoracic spine, lumbar spine good range of motion.  No midline spinal tenderness.  No SI joint tenderness.  Shoulder joints, elbow joints, wrist joints, MCPs, PIPs, DIPs good range of motion with no synovitis.  She is complete fist formation bilaterally.  Hip joints good range of motion with no discomfort.  No tenderness of trochanter bursa bilaterally.  No warmth or effusion bilateral knee joints.  She has good range of motion bilaterally.  She has bilateral knee crepitus.  No tenderness or swelling of ankle joints on exam.  No tenderness of MTPs.  CDAI Exam: CDAI Score: 0.2  Patient Global Assessment: 1 (mm); Provider Global Assessment: 1 (mm) Swollen: 0 ; Tender: 0  Joint Exam   Not documented   There is currently no information documented on the homunculus. Go to the Rheumatology activity and complete the homunculus joint exam.  Investigation: No additional findings.  Imaging: No results found.  Recent Labs: Lab Results  Component Value Date   WBC 8.0 07/16/2017   HGB 12.5 07/16/2017   PLT 357 07/16/2017   NA 139 07/16/2017   K 4.3 07/16/2017   CL 102 07/16/2017   CO2  28 07/16/2017   GLUCOSE 90 07/16/2017   BUN 14 07/16/2017   CREATININE 0.90 07/16/2017   BILITOT 0.5 07/16/2017   ALKPHOS 70 10/06/2013   AST 19 07/16/2017   ALT 21 07/16/2017   PROT 6.9 07/16/2017   ALBUMIN 3.9 10/06/2013   CALCIUM 9.2 07/16/2017   GFRAA 92 07/16/2017    Speciality Comments: PLQ eye exam: 07/02/2017 Normal. [facility]. Follow up in 6 months.  Procedures:  No procedures performed Allergies: Patient has no known allergies.   Assessment / Plan:     Visit Diagnoses: Rheumatoid arthritis involving multiple sites with positive rheumatoid factor (HCC) -  Positive RF, positive ANA: She has no synovitis on exam.  She has not had any recent rheumatoid arthritis flares.  She has no joint pain or joint swelling at this time. She has occasional bilateral knee pain.  Bilateral knee crepitus.  No warmth or effusion noted.  She has been taking Plaquenil 200 mg 1 tablet by mouth daily.  A refill of Plaquenil sent to the pharmacy today.  Her only concern today was increased fatigue.  She declined wanting additional blood tests, such as TSH, CK, or B12.  We discussed a  sleep study but she reports that her insurance would not cover it.  She was advised to notify us if she develops increased joint pain or joint swelling.  She will follow-up in the office in 5 months.  High risk medication use -  PLQ 200 mg p.o. 1 tablet daily.  CBC and CMP will be drawn today to monitor for drug toxicity.- Plan: CBC with Differential/Platelet, COMPLETE METABOLIC PANEL WITH GFR  Anticardiolipin antibody positive - Negative in 2018  Elevated beta-2  - Negative in November 2018  Primary osteoarthritis of both hands: She has complete fist formation bilaterally.  She has no discomfort at this time.  Joint protection and muscle strengthening were discussed.   Carpal tunnel syndrome of left wrist: She is asymptomatic at this time.   Cervical radiculitis: She has no neck pain at this time.  She has no  symptoms of radiculopathy.   History of depression   Orders: No orders of the defined types were placed in this encounter.  No orders of the defined types were placed in this encounter.   Follow-Up Instructions: Return in about 5 months (around 05/17/2018) for Rheumatoid arthritis, Osteoarthritis.   Gearldine Bienenstock, PA-C  Note - This record has been created using Dragon software.  Chart creation errors have been sought, but may not always  have been located. Such creation errors do not reflect on  the standard of medical care.

## 2017-12-16 ENCOUNTER — Ambulatory Visit (INDEPENDENT_AMBULATORY_CARE_PROVIDER_SITE_OTHER): Payer: BLUE CROSS/BLUE SHIELD | Admitting: Physician Assistant

## 2017-12-16 ENCOUNTER — Encounter: Payer: Self-pay | Admitting: Physician Assistant

## 2017-12-16 VITALS — BP 152/93 | HR 72 | Resp 16 | Ht 70.0 in | Wt 357.4 lb

## 2017-12-16 DIAGNOSIS — Z79899 Other long term (current) drug therapy: Secondary | ICD-10-CM | POA: Diagnosis not present

## 2017-12-16 DIAGNOSIS — M19041 Primary osteoarthritis, right hand: Secondary | ICD-10-CM

## 2017-12-16 DIAGNOSIS — R7989 Other specified abnormal findings of blood chemistry: Secondary | ICD-10-CM

## 2017-12-16 DIAGNOSIS — R760 Raised antibody titer: Secondary | ICD-10-CM

## 2017-12-16 DIAGNOSIS — M19042 Primary osteoarthritis, left hand: Secondary | ICD-10-CM

## 2017-12-16 DIAGNOSIS — Z8659 Personal history of other mental and behavioral disorders: Secondary | ICD-10-CM

## 2017-12-16 DIAGNOSIS — G5602 Carpal tunnel syndrome, left upper limb: Secondary | ICD-10-CM

## 2017-12-16 DIAGNOSIS — M0579 Rheumatoid arthritis with rheumatoid factor of multiple sites without organ or systems involvement: Secondary | ICD-10-CM | POA: Diagnosis not present

## 2017-12-16 DIAGNOSIS — M5412 Radiculopathy, cervical region: Secondary | ICD-10-CM

## 2017-12-16 LAB — CBC WITH DIFFERENTIAL/PLATELET
BASOS ABS: 52 {cells}/uL (ref 0–200)
Basophils Relative: 0.8 %
EOS ABS: 143 {cells}/uL (ref 15–500)
Eosinophils Relative: 2.2 %
HCT: 37.6 % (ref 35.0–45.0)
Hemoglobin: 12.3 g/dL (ref 11.7–15.5)
Lymphs Abs: 1528 cells/uL (ref 850–3900)
MCH: 25.9 pg — AB (ref 27.0–33.0)
MCHC: 32.7 g/dL (ref 32.0–36.0)
MCV: 79.3 fL — ABNORMAL LOW (ref 80.0–100.0)
MONOS PCT: 7.1 %
MPV: 8.8 fL (ref 7.5–12.5)
NEUTROS ABS: 4316 {cells}/uL (ref 1500–7800)
Neutrophils Relative %: 66.4 %
PLATELETS: 309 10*3/uL (ref 140–400)
RBC: 4.74 10*6/uL (ref 3.80–5.10)
RDW: 13.7 % (ref 11.0–15.0)
TOTAL LYMPHOCYTE: 23.5 %
WBC mixed population: 462 cells/uL (ref 200–950)
WBC: 6.5 10*3/uL (ref 3.8–10.8)

## 2017-12-16 LAB — COMPLETE METABOLIC PANEL WITH GFR
AG RATIO: 1.5 (calc) (ref 1.0–2.5)
ALBUMIN MSPROF: 4.1 g/dL (ref 3.6–5.1)
ALKALINE PHOSPHATASE (APISO): 78 U/L (ref 33–115)
ALT: 23 U/L (ref 6–29)
AST: 17 U/L (ref 10–30)
BILIRUBIN TOTAL: 0.4 mg/dL (ref 0.2–1.2)
BUN: 13 mg/dL (ref 7–25)
CHLORIDE: 104 mmol/L (ref 98–110)
CO2: 28 mmol/L (ref 20–32)
Calcium: 9 mg/dL (ref 8.6–10.2)
Creat: 0.78 mg/dL (ref 0.50–1.10)
GFR, Est African American: 109 mL/min/{1.73_m2} (ref >=60)
GFR, Est Non African American: 94 mL/min/{1.73_m2} (ref >=60)
GLUCOSE: 103 mg/dL — AB (ref 65–99)
Globulin: 2.8 g/dL (calc) (ref 1.9–3.7)
POTASSIUM: 4.8 mmol/L (ref 3.5–5.3)
Sodium: 139 mmol/L (ref 135–146)
TOTAL PROTEIN: 6.9 g/dL (ref 6.1–8.1)

## 2017-12-16 MED ORDER — HYDROXYCHLOROQUINE SULFATE 200 MG PO TABS: 200.0000 mg | ORAL_TABLET | Freq: Every day | ORAL | 0 refills | Status: DC

## 2017-12-17 NOTE — Progress Notes (Signed)
Labs are stable.

## 2018-04-08 ENCOUNTER — Other Ambulatory Visit: Payer: Self-pay | Admitting: Physician Assistant

## 2018-04-08 NOTE — Telephone Encounter (Signed)
Last Visit: 12/16/17 Next Visit: 05/19/18 Labs: 12/16/17 stable PLQ eye exam: 07/02/2017 Normal.  Okay to refill per Dr. Corliss Skains

## 2018-05-05 NOTE — Progress Notes (Signed)
Office Visit Note  Patient: Renee SaleLinley A Garoutte             Date of Birth: August 14, 1975           MRN: [id number]             PCP: Jarrett SohoWharton, Courtney, PA-C Referring: Jarrett SohoWharton, Courtney, PA-C Visit Date: 05/19/2018 Occupation: @GUAROCC @  Subjective:  Fatigue    History of Present Illness: Renee Howard is a 43 y.o. female with history of seropositive rheumatoid arthritis and osteoarthritis.  She is taking PLQ 200 mg 1 table po daily.   She denies any recent rheumatoid arthritis flares.  She states that midway through her menstrual cycle she does develop increased arthralgias and fatigue.  She denies any joint swelling.  He is having discomfort in the right elbow joint and states that some pain radiates down the right forearm.  She states that time she experiences stiffness in her hands as well as numbness in her fingers with certain positions at work and while typing. She has occasional discomfort in both knee joints.   She states she has been having significant fatigue.  She states she was evaluated by her PCP and has had lab work in the past which has been normal.  She has been sleeping about 6 hours per night and does not wake up feeling rested. She has interrupted sleep at night due to nocturia, which is not a new concern. She has to a couple times throughout the day on the weekends.  She has significant daytime drowsiness, and reports falling asleep at work and in the car.   Activities of Daily Living:  Patient reports morning stiffness for 5-10 minutes.   Patient Denies nocturnal pain.  Difficulty dressing/grooming: Denies Difficulty climbing stairs: Reports Difficulty getting out of chair: Reports Difficulty using hands for taps, buttons, cutlery, and/or writing: Reports  Review of Systems  Constitutional: Positive for fatigue.  HENT: Positive for mouth dryness. Negative for mouth sores and nose dryness.   Eyes: Positive for dryness. Negative for pain, itching and visual disturbance.    Respiratory: Negative for cough, hemoptysis, shortness of breath, wheezing and difficulty breathing.   Cardiovascular: Negative for chest pain, palpitations, hypertension and swelling in legs/feet.  Gastrointestinal: Negative for abdominal pain, blood in stool, constipation and diarrhea.  Endocrine: Negative for increased urination.  Genitourinary: Positive for nocturia. Negative for painful urination and pelvic pain.  Musculoskeletal: Positive for arthralgias, joint pain and morning stiffness. Negative for joint swelling, myalgias, muscle weakness, muscle tenderness and myalgias.  Skin: Negative for color change, pallor, rash, hair loss, nodules/bumps, redness, skin tightness, ulcers and sensitivity to sunlight.  Allergic/Immunologic: Negative for susceptible to infections.  Neurological: Positive for dizziness and headaches. Negative for numbness and weakness.  Hematological: Negative for swollen glands.  Psychiatric/Behavioral: Negative for depressed mood, confusion and sleep disturbance. The patient is not nervous/anxious.     PMFS History:  Patient Active Problem List   Diagnosis Date Noted  . Primary osteoarthritis of both hands 12/26/2016  . High risk medication use 06/28/2016  . Anticardiolipin antibody positive 06/18/2016  . Elevated beta-2  06/18/2016  . Numbness 09/03/2014  . Urinary frequency 09/03/2014  . Gait disturbance 09/03/2014  . Arm pain, left 09/03/2014  . Carpal tunnel syndrome 09/03/2014  . Cervical radiculitis 09/03/2014  . Rheumatoid arthritis involving multiple sites with positive rheumatoid factor (HCC) 09/03/2014    Past Medical History:  Diagnosis Date  . Arthritis   . DVT (deep venous thrombosis) (HCC)   .  No pertinent past medical history   . PONV (postoperative nausea and vomiting)     History reviewed. No pertinent family history. Past Surgical History:  Procedure Laterality Date  . CESAREAN SECTION     2009  . HERNIA REPAIR     1978    Social History   Social History Narrative  . Not on file    There is no immunization history on file for this patient.   Objective: Vital Signs: BP (!) 141/86 (BP Location: Left Wrist, Patient Position: Sitting, Cuff Size: Normal)   Pulse 83   Resp 15   Ht 5\' 10"  (1.778 m)   Wt (!) 362 lb 3.2 oz (164.3 kg)   BMI 51.97 kg/m    Physical Exam Vitals signs and nursing note reviewed.  Constitutional:      Appearance: She is well-developed.  HENT:     Head: Normocephalic and atraumatic.  Eyes:     Conjunctiva/sclera: Conjunctivae normal.  Neck:     Musculoskeletal: Normal range of motion.  Cardiovascular:     Rate and Rhythm: Normal rate and regular rhythm.     Heart sounds: Normal heart sounds.  Pulmonary:     Effort: Pulmonary effort is normal.     Breath sounds: Normal breath sounds.  Abdominal:     General: Bowel sounds are normal.     Palpations: Abdomen is soft.  Lymphadenopathy:     Cervical: No cervical adenopathy.  Skin:    General: Skin is warm and dry.     Capillary Refill: Capillary refill takes less than 2 seconds.  Neurological:     Mental Status: She is alert and oriented to person, place, and time.  Psychiatric:        Behavior: Behavior normal.      Musculoskeletal Exam: C-spine, thoracic spine and lumbar spine good range of motion.  No midline spinal tenderness.  No SI joint tenderness.  Shoulder joints, elbow joints, joints, MCPs, PIPs, DIPs good range of motion no synovitis.  Right lateral epicondylitis.  She has complete fist formation bilaterally.  Hip joints, knee joints, ankle joints, MTPs, PIPs, DIPs good range of motion no synovitis.  No warmth or effusion bilateral knee joints.  She has bilateral knee crepitus.  No tenderness or swelling of ankle joints.  No tenderness of MTP joints.  No tenderness over trochanteric bursa bilaterally.  CDAI Exam: CDAI Score: 0.2  Patient Global Assessment: 1 (mm); Provider Global Assessment: 1  (mm) Swollen: 0 ; Tender: 0  Joint Exam   Not documented   There is currently no information documented on the homunculus. Go to the Rheumatology activity and complete the homunculus joint exam.  Investigation: No additional findings.  Imaging: No results found.  Recent Labs: Lab Results  Component Value Date   WBC 6.5 12/16/2017   HGB 12.3 12/16/2017   PLT 309 12/16/2017   NA 139 12/16/2017   K 4.8 12/16/2017   CL 104 12/16/2017   CO2 28 12/16/2017   GLUCOSE 103 (H) 12/16/2017   BUN 13 12/16/2017   CREATININE 0.78 12/16/2017   BILITOT 0.4 12/16/2017   ALKPHOS 70 10/06/2013   AST 17 12/16/2017   ALT 23 12/16/2017   PROT 6.9 12/16/2017   ALBUMIN 3.9 10/06/2013   CALCIUM 9.0 12/16/2017   GFRAA 109 12/16/2017    Speciality Comments: PLQ eye exam: 07/02/2017 Normal. [facility]. Follow up in 6 months.  Procedures:  No procedures performed Allergies: Patient has no known allergies.  Assessment / Plan:     Visit Diagnoses: Rheumatoid arthritis involving multiple sites with positive rheumatoid factor (HCC) - Positive RF, positive ANA: She has no synovitis or tenderness on exam.  She has not had any recent rheumatoid arthritis flares.  She has occasional discomfort in bilateral knee joints but no warmth or effusion was noted.  She reports that mood menstrual cycle and she develops increased arthralgias and fatigue.  She is been having significant daytime drowsiness and will be seeing a sleep specialist.  She is clinically doing well on Plaquenil 200 mg 1 tablet by mouth daily.  She was previously taking Plaquenil 200 mg twice daily Monday to Friday, and she would like to resume this dose.  A refill of Plaquenil will be sent to the pharmacy today.  She was also advised to notify us if she develops increased joint pain or joint swelling.  She will follow-up in the office in 5 months.  High risk medication use - PLQ 200 mg by mouth BID M-F.  A refill will be sent to the  pharmacy. Last Plaquenil eye exam normal on 07/02/2017 and was recommended to follow-up in 6 months.  Most recent CBC/CMP stable on 12/16/2017.  Due for CBC/CMP today and will monitor every 5 months..- Plan: CBC with Differential/Platelet, COMPLETE METABOLIC PANEL WITH GFR  Anticardiolipin antibody positive - negative 2018. She is taking aspirin 81 mg po twice a week.   Elevated beta-2  - negative 2018  Primary osteoarthritis of both hands: She has no tenderness or synovitis at this time.  She has complete fist formation bilaterally.  Joint protection and muscle strengthening were discussed.   Lateral epicondylitis, right elbow: She has tenderness over the right lateral epicondyle.  She was given a handout of exercises to perform.   Other medical conditions are listed as follows:   Carpal tunnel syndrome of left wrist  Cervical radiculitis  History of depression   Orders: Orders Placed This Encounter  Procedures  . CBC with Differential/Platelet  . COMPLETE METABOLIC PANEL WITH GFR   Meds ordered this encounter  Medications  . hydroxychloroquine (PLAQUENIL) 200 MG tablet    Sig: Take 1 tablet by mouth twice daily Monday through Friday only.    Dispense:  120 tablet    Refill:  0      Follow-Up Instructions: Return in about 5 months (around 10/19/2018) for Rheumatoid arthritis, Osteoarthritis.   Gearldine Bienenstock, PA-C  Note - This record has been created using Dragon software.  Chart creation errors have been sought, but may not always  have been located. Such creation errors do not reflect on  the standard of medical care.

## 2018-05-19 ENCOUNTER — Ambulatory Visit: Payer: Managed Care, Other (non HMO) | Admitting: Physician Assistant

## 2018-05-19 ENCOUNTER — Telehealth: Payer: Self-pay | Admitting: Rheumatology

## 2018-05-19 ENCOUNTER — Encounter: Payer: Self-pay | Admitting: Physician Assistant

## 2018-05-19 VITALS — BP 141/86 | HR 83 | Resp 15 | Ht 70.0 in | Wt 362.2 lb

## 2018-05-19 DIAGNOSIS — M0579 Rheumatoid arthritis with rheumatoid factor of multiple sites without organ or systems involvement: Secondary | ICD-10-CM

## 2018-05-19 DIAGNOSIS — Z79899 Other long term (current) drug therapy: Secondary | ICD-10-CM

## 2018-05-19 DIAGNOSIS — R760 Raised antibody titer: Secondary | ICD-10-CM

## 2018-05-19 DIAGNOSIS — R7989 Other specified abnormal findings of blood chemistry: Secondary | ICD-10-CM

## 2018-05-19 DIAGNOSIS — M19041 Primary osteoarthritis, right hand: Secondary | ICD-10-CM

## 2018-05-19 DIAGNOSIS — G5602 Carpal tunnel syndrome, left upper limb: Secondary | ICD-10-CM

## 2018-05-19 DIAGNOSIS — M5412 Radiculopathy, cervical region: Secondary | ICD-10-CM

## 2018-05-19 DIAGNOSIS — M19042 Primary osteoarthritis, left hand: Secondary | ICD-10-CM

## 2018-05-19 DIAGNOSIS — Z8659 Personal history of other mental and behavioral disorders: Secondary | ICD-10-CM

## 2018-05-19 DIAGNOSIS — M7711 Lateral epicondylitis, right elbow: Secondary | ICD-10-CM

## 2018-05-19 MED ORDER — HYDROXYCHLOROQUINE SULFATE 200 MG PO TABS: ORAL_TABLET | ORAL | 0 refills | Status: DC

## 2018-05-19 NOTE — Telephone Encounter (Signed)
Patient left a voicemail requesting a referral for sleep analysis at [facility] on [address] in [place].  Patient states Ladona Ridgel told her to contact the office if a referral was required.

## 2018-05-19 NOTE — Patient Instructions (Signed)
Tennis Elbow  Tennis elbow is swelling (inflammation) in your outer forearm, near your elbow. Swelling affects the tissues that connect muscle to bone (tendons). Tennis elbow can happen in any sport or job in which you use your elbow too much. It is caused by doing the same motion over and over. Tennis elbow can cause:   Pain and tenderness in your forearm and the outer part of your elbow. You may have pain all the time, or only when using the arm.   A burning feeling. This runs from your elbow through your arm.   Weak grip in your hand.  Follow these instructions at home:  Activity   Rest your elbow and wrist. Avoid activities that cause problems, as told by your doctor.   If told by your doctor, wear an elbow strap to reduce stress on the area.   Do physical therapy exercises as told.   If you lift an object, lift it with your palm facing up. This is easier on your elbow.  Lifestyle   If your tennis elbow is caused by sports, check your equipment and make sure that:  ? You are using it correctly.  ? It fits you well.   If your tennis elbow is caused by work or by using a computer, take breaks often to stretch your arm. Talk with your manager about how you can manage your condition at work.  If you have a brace:   Wear the brace as told by your doctor. Remove it only as told by your doctor.   Loosen the brace if your fingers tingle, get numb, or turn cold and blue.   Keep the brace clean.   If the brace is not waterproof, ask your doctor if you may take the brace off for bathing. If you must keep the brace on while bathing:  ? Do not let it get wet.  ? Cover it with a watertight covering when you take a bath or a shower.  General instructions     If told, put ice on the painful area:  ? Put ice in a plastic bag.  ? Place a towel between your skin and the bag.  ? Leave the ice on for 20 minutes, 2-3 times a day.   Take over-the-counter and prescription medicines only as told by your doctor.   Keep  all follow-up visits as told by your doctor. This is important.  Contact a doctor if:   Your pain does not get better with treatment.   Your pain gets worse.   You have weakness in your forearm, hand, or fingers.   You cannot feel your forearm, hand, or fingers.  Summary   Tennis elbow is swelling (inflammation) in your outer forearm, near your elbow.   Tennis elbow is caused by doing the same motion over and over.   Rest your elbow and wrist. Avoid activities that cause problems, as told by your doctor.   If told, put ice on the painful area for 20 minutes, 2-3 times a day.  This information is not intended to replace advice given to you by your health care provider. Make sure you discuss any questions you have with your health care provider.  Document Released: 08/16/2009 Document Revised: 12/11/2016 Document Reviewed: 12/11/2016  Elsevier Interactive Patient Education  2019 [employer]

## 2018-05-19 NOTE — Telephone Encounter (Signed)
Per Dr. Corliss Skains patient should get referral from PCP.   Attempted to contact the patient and left message for patient to call the office.

## 2018-05-20 LAB — CBC WITH DIFFERENTIAL/PLATELET
ABSOLUTE MONOCYTES: 410 {cells}/uL (ref 200–950)
BASOS ABS: 38 {cells}/uL (ref 0–200)
BASOS PCT: 0.6 %
EOS ABS: 139 {cells}/uL (ref 15–500)
Eosinophils Relative: 2.2 %
HCT: 36.2 % (ref 35.0–45.0)
HEMOGLOBIN: 11.8 g/dL (ref 11.7–15.5)
Lymphs Abs: 1405 cells/uL (ref 850–3900)
MCH: 25.1 pg — AB (ref 27.0–33.0)
MCHC: 32.6 g/dL (ref 32.0–36.0)
MCV: 76.9 fL — AB (ref 80.0–100.0)
MPV: 9.5 fL (ref 7.5–12.5)
Monocytes Relative: 6.5 %
NEUTROS ABS: 4309 {cells}/uL (ref 1500–7800)
Neutrophils Relative %: 68.4 %
Platelets: 357 10*3/uL (ref 140–400)
RBC: 4.71 10*6/uL (ref 3.80–5.10)
RDW: 14 % (ref 11.0–15.0)
TOTAL LYMPHOCYTE: 22.3 %
WBC: 6.3 10*3/uL (ref 3.8–10.8)

## 2018-05-20 LAB — COMPLETE METABOLIC PANEL WITH GFR
AG Ratio: 1.5 (calc) (ref 1.0–2.5)
ALT: 18 U/L (ref 6–29)
AST: 18 U/L (ref 10–30)
Albumin: 3.9 g/dL (ref 3.6–5.1)
Alkaline phosphatase (APISO): 73 U/L (ref 31–125)
BUN: 14 mg/dL (ref 7–25)
CALCIUM: 9 mg/dL (ref 8.6–10.2)
CO2: 26 mmol/L (ref 20–32)
CREATININE: 0.82 mg/dL (ref 0.50–1.10)
Chloride: 105 mmol/L (ref 98–110)
GFR, EST AFRICAN AMERICAN: 102 mL/min/{1.73_m2} (ref >=60)
GFR, Est Non African American: 88 mL/min/{1.73_m2} (ref >=60)
GLOBULIN: 2.6 g/dL (ref 1.9–3.7)
Glucose, Bld: 120 mg/dL — ABNORMAL HIGH (ref 65–99)
Potassium: 4.2 mmol/L (ref 3.5–5.3)
SODIUM: 140 mmol/L (ref 135–146)
TOTAL PROTEIN: 6.5 g/dL (ref 6.1–8.1)
Total Bilirubin: 0.5 mg/dL (ref 0.2–1.2)

## 2018-05-20 NOTE — Telephone Encounter (Signed)
Spoke with patient and advised she will need to get referral from PCP. Patient verbalized understanding.

## 2018-05-20 NOTE — Progress Notes (Signed)
CBC stable. Glucose is elevated-120. Rest of CMP WNL.

## 2018-07-02 ENCOUNTER — Other Ambulatory Visit: Payer: Self-pay | Admitting: Physician Assistant

## 2018-07-02 DIAGNOSIS — M0579 Rheumatoid arthritis with rheumatoid factor of multiple sites without organ or systems involvement: Secondary | ICD-10-CM

## 2018-07-02 NOTE — Telephone Encounter (Signed)
Last Visit: 05/19/2018 Next Visit: 10/22/2018 Labs: 05/19/2018 CBC stable. Glucose is elevated-120. Rest of CMP WNL.  Eye exam: 07/02/2017  Okay to refill per Dr. Corliss Skains.

## 2018-07-25 ENCOUNTER — Telehealth: Payer: Self-pay | Admitting: *Deleted

## 2018-07-25 NOTE — Telephone Encounter (Signed)
REFERRAL SENT TO SCHEDULING AND NOTES ON FILE FROM DR. ALAN ROSS [phone]. °

## 2018-08-17 ENCOUNTER — Other Ambulatory Visit: Payer: Self-pay | Admitting: Physician Assistant

## 2018-08-17 DIAGNOSIS — M0579 Rheumatoid arthritis with rheumatoid factor of multiple sites without organ or systems involvement: Secondary | ICD-10-CM

## 2018-08-18 NOTE — Telephone Encounter (Signed)
Spoke with patient and advised she is due for PLQ eye exam. Patient states she does not need a refill on her PLQ at this time.

## 2018-10-08 NOTE — Progress Notes (Signed)
Office Visit Note  Patient: Renee Howard             Date of Birth: 09/08/1975           MRN: [id number]             PCP: Marda Stalker, PA-C Referring: Marda Stalker, PA-C Visit Date: 10/22/2018 Occupation: @GUAROCC @  Subjective:  Right elbow joint pain   History of Present Illness: Renee Howard is a 43 y.o. female with history of seropositive rheumatoid arthritis and osteoarthritis.  She takes Plaquenil 200 mg 1 tablet by mouth BID M-F.  She states she missed several doses of PLQ a couple weeks ago but otherwise she has been compliant taking PLQ.  She denies any recent rheumatoid arthritis flares.  She states for the past 2 months she has been experiencing right elbow joint pain, especially with flexion.  She denies any joint swelling.  She denies performing overuse activities.  She has been having increased pain in bilateral index fingers, especially in the DIP joint.  She denies any other joint pain or joint swelling.    Activities of Daily Living:  Patient reports morning stiffness for 0 minutes.   Patient Denies nocturnal pain.  Difficulty dressing/grooming: Denies Difficulty climbing stairs: Reports Difficulty getting out of chair: Denies Difficulty using hands for taps, buttons, cutlery, and/or writing: Reports  Review of Systems  Constitutional: Positive for fatigue.  HENT: Negative for mouth sores, mouth dryness and nose dryness.   Eyes: Positive for dryness. Negative for pain and visual disturbance.  Respiratory: Negative for cough, hemoptysis, shortness of breath and difficulty breathing.   Cardiovascular: Negative for chest pain, palpitations, hypertension and swelling in legs/feet.  Gastrointestinal: Negative for blood in stool, constipation and diarrhea.  Endocrine: Negative for increased urination.  Genitourinary: Negative for painful urination.  Musculoskeletal: Positive for arthralgias and joint pain. Negative for joint swelling, myalgias, muscle  weakness, morning stiffness, muscle tenderness and myalgias.  Skin: Negative for color change, pallor, rash, hair loss, nodules/bumps, skin tightness, ulcers and sensitivity to sunlight.  Allergic/Immunologic: Negative for susceptible to infections.  Neurological: Negative for dizziness, numbness, headaches and weakness.  Hematological: Negative for swollen glands.  Psychiatric/Behavioral: Positive for depressed mood. Negative for sleep disturbance. The patient is nervous/anxious (Lexapro).     PMFS History:  Patient Active Problem List   Diagnosis Date Noted  . Primary osteoarthritis of both hands 12/26/2016  . High risk medication use 06/28/2016  . Anticardiolipin antibody positive 06/18/2016  . Elevated beta-2  06/18/2016  . Numbness 09/03/2014  . Urinary frequency 09/03/2014  . Gait disturbance 09/03/2014  . Arm pain, left 09/03/2014  . Carpal tunnel syndrome 09/03/2014  . Cervical radiculitis 09/03/2014  . Rheumatoid arthritis involving multiple sites with positive rheumatoid factor ([place]) 09/03/2014    Past Medical History:  Diagnosis Date  . Arthritis   . DVT (deep venous thrombosis) ([place])   . No pertinent past medical history   . PONV (postoperative nausea and vomiting)     History reviewed. No pertinent family history. Past Surgical History:  Procedure Laterality Date  . CESAREAN SECTION     2009  . [place]   Social History   Social History Narrative  . Not on file    There is no immunization history on file for this patient.   Objective: Vital Signs: BP 130/82 (BP Location: Left Wrist, Patient Position: Sitting, Cuff Size: Normal)   Pulse 72   Resp 15  Ht 5\' 10"  (1.778 m)   Wt (!) 353 lb (160.1 kg)   BMI 50.65 kg/m    Physical Exam Vitals signs and nursing note reviewed.  Constitutional:      Appearance: She is well-developed.  HENT:     Head: Normocephalic and atraumatic.  Eyes:     Conjunctiva/sclera: Conjunctivae normal.   Neck:     Musculoskeletal: Normal range of motion.  Cardiovascular:     Rate and Rhythm: Normal rate and regular rhythm.     Heart sounds: Normal heart sounds.  Pulmonary:     Effort: Pulmonary effort is normal.     Breath sounds: Normal breath sounds.  Abdominal:     General: Bowel sounds are normal.     Palpations: Abdomen is soft.  Lymphadenopathy:     Cervical: No cervical adenopathy.  Skin:    General: Skin is warm and dry.     Capillary Refill: Capillary refill takes less than 2 seconds.  Neurological:     Mental Status: She is alert and oriented to person, place, and time.  Psychiatric:        Behavior: Behavior normal.      Musculoskeletal Exam: C-spine, thoracic spine, and lumbar spine good ROM.  Shoulder joints, elbow joints, wrist joints, MCPs, PIPs, and DIPs good ROM with no synovitis. Right lateral epicondylitis.  Tenderness of the right 1st DIP joint.  DIP synovial thickening consistent with osteoarthritis.  Hip joints, knee joints, ankle joints, MTPs, PIPs, and DIPs good ROM with no synovitis.  No warmth or effusion of knee joints.  No tenderness or swelling of ankle joints.   CDAI Exam: CDAI Score: - Patient Global: 2 mm; Provider Global: - Swollen: 0 ; Tender: 2  Joint Exam      Right  Left  DIP 2   Tender   Tender     Investigation: No additional findings.  Imaging: No results found.  Recent Labs: Lab Results  Component Value Date   WBC 6.3 05/19/2018   HGB 11.8 05/19/2018   PLT 357 05/19/2018   NA 140 05/19/2018   K 4.2 05/19/2018   CL 105 05/19/2018   CO2 26 05/19/2018   GLUCOSE 120 (H) 05/19/2018   BUN 14 05/19/2018   CREATININE 0.82 05/19/2018   BILITOT 0.5 05/19/2018   ALKPHOS 70 10/06/2013   AST 18 05/19/2018   ALT 18 05/19/2018   PROT 6.5 05/19/2018   ALBUMIN 3.9 10/06/2013   CALCIUM 9.0 05/19/2018   GFRAA 102 05/19/2018    Speciality Comments: PLQ eye exam: 07/02/2017 Normal. [facility]. Follow up in 6 months.   Procedures:  No procedures performed Allergies: Patient has no known allergies.   Assessment / Plan:     Visit Diagnoses: Rheumatoid arthritis involving multiple site with positive rheumatoid factor (HCC) - Positive RF, positive ANA -She has no synovitis on exam.  She has not had any recent rheumatoid arthritis flares.  She is clinically doing well on Plaquenil 200 mg 1 tablet by mouth twice daily M-F.  She has pain in bilateral 1st DIP joints due to early stage osteoarthritis in both hands.  She has no inflammation at this time.  She will continue taking Plaquenil 200 mg 1 tablet BID M-F.  She does not need a refill at this time.  She was advised to notify 07/04/2017 if she develops increased joint pain or joint swelling.  She will follow up in 5 months.   High risk medication use - PLQ 200  mg by mouth BID M-F. CBC and CMP will be drawn today to monitor for drug toxicity. PLQ eye exam 07/02/17 normal.  [facility].  She was given a PLQ eye exam form and encouraged to schedule an updated exam.  - Plan: CBC with Differential/Platelet, COMPLETE METABOLIC PANEL WITH GFR  Anticardiolipin antibody positive - Negative in 2018   Elevated beta-2  - Negative in 2018   Primary osteoarthritis of both hands - She has DIP synovial thickening consistent with early stage osteoarthritis.  She has been having pain in bilateral first DIP joints.  She has no synovitis on exam.  Joint protection and muscle strengthening were discussed. She was given a handout of hand exercises to perform. We also recommended using voltaren gel topically as needed for pain relief.   Carpal tunnel syndrome of left wrist - Resolved   Cervical radiculitis - She has no neck pain or stiffness at this time.   History of depression - She is taking Lexapro as prescribed.   Lateral epicondylitis, right elbow - She has tenderness over the lateral epicondyle of the right elbow joint.  She was given a handout of stretching exercises to  perform.  We also recommended using voltaren gel topically as needed for pain relief. We also recommended using a tennis elbow brace.  She was advised to notify us if she has persistent or worsening symptoms and she can return for a cortisone injection.    Orders: Orders Placed This Encounter  Procedures  . CBC with Differential/Platelet  . COMPLETE METABOLIC PANEL WITH GFR   No orders of the defined types were placed in this encounter.     Follow-Up Instructions: Return in about 5 months (around 03/24/2019) for Rheumatoid arthritis, Osteoarthritis.   Gearldine Bienenstock, PA-C   I examined and evaluated the patient with Sherron Ales PA. The plan of care was discussed as noted above.  Pollyann Savoy, MD Note - This record has been created using Animal nutritionist.  Chart creation errors have been sought, but may not always  have been located. Such creation errors do not reflect on  the standard of medical care.

## 2018-10-22 ENCOUNTER — Encounter: Payer: Self-pay | Admitting: Physician Assistant

## 2018-10-22 ENCOUNTER — Other Ambulatory Visit: Payer: Self-pay

## 2018-10-22 ENCOUNTER — Ambulatory Visit: Payer: Managed Care, Other (non HMO) | Admitting: Physician Assistant

## 2018-10-22 VITALS — BP 130/82 | HR 72 | Resp 15 | Ht 70.0 in | Wt 353.0 lb

## 2018-10-22 DIAGNOSIS — Z8659 Personal history of other mental and behavioral disorders: Secondary | ICD-10-CM

## 2018-10-22 DIAGNOSIS — Z79899 Other long term (current) drug therapy: Secondary | ICD-10-CM | POA: Diagnosis not present

## 2018-10-22 DIAGNOSIS — M7711 Lateral epicondylitis, right elbow: Secondary | ICD-10-CM

## 2018-10-22 DIAGNOSIS — M19042 Primary osteoarthritis, left hand: Secondary | ICD-10-CM

## 2018-10-22 DIAGNOSIS — R7989 Other specified abnormal findings of blood chemistry: Secondary | ICD-10-CM | POA: Diagnosis not present

## 2018-10-22 DIAGNOSIS — M0579 Rheumatoid arthritis with rheumatoid factor of multiple sites without organ or systems involvement: Secondary | ICD-10-CM | POA: Diagnosis not present

## 2018-10-22 DIAGNOSIS — M5412 Radiculopathy, cervical region: Secondary | ICD-10-CM

## 2018-10-22 DIAGNOSIS — G5602 Carpal tunnel syndrome, left upper limb: Secondary | ICD-10-CM

## 2018-10-22 DIAGNOSIS — M19041 Primary osteoarthritis, right hand: Secondary | ICD-10-CM

## 2018-10-22 DIAGNOSIS — R760 Raised antibody titer: Secondary | ICD-10-CM

## 2018-10-22 NOTE — Patient Instructions (Addendum)
You can try applying voltaren gel (over the counter) topically for pain relief    Hand Exercises Hand exercises can be helpful for almost anyone. These exercises can strengthen the hands, improve flexibility and movement, and increase blood flow to the hands. These results can make work and daily tasks easier. Hand exercises can be especially helpful for people who have joint pain from arthritis or have nerve damage from overuse (carpal tunnel syndrome). These exercises can also help people who have injured a hand. Exercises Most of these hand exercises are gentle stretching and motion exercises. It is usually safe to do them often throughout the day. Warming up your hands before exercise may help to reduce stiffness. You can do this with gentle massage or by placing your hands in warm water for 10-15 minutes. It is normal to feel some stretching, pulling, tightness, or mild discomfort as you begin new exercises. This will gradually improve. Stop an exercise right away if you feel sudden, severe pain or your pain gets worse. Ask your health care provider which exercises are best for you. Knuckle bend or "claw" fist 1. Stand or sit with your arm, hand, and all five fingers pointed straight up. Make sure to keep your wrist straight during the exercise. 2. Gently bend your fingers down toward your palm until the tips of your fingers are touching the top of your palm. Keep your big knuckle straight and just bend the small knuckles in your fingers. 3. Hold this position for __________ seconds. 4. Straighten (extend) your fingers back to the starting position. Repeat this exercise 5-10 times with each hand. Full finger fist 1. Stand or sit with your arm, hand, and all five fingers pointed straight up. Make sure to keep your wrist straight during the exercise. 2. Gently bend your fingers into your palm until the tips of your fingers are touching the middle of your palm. 3. Hold this position for __________  seconds. 4. Extend your fingers back to the starting position, stretching every joint fully. Repeat this exercise 5-10 times with each hand. Straight fist 1. Stand or sit with your arm, hand, and all five fingers pointed straight up. Make sure to keep your wrist straight during the exercise. 2. Gently bend your fingers at the big knuckle, where your fingers meet your hand, and the middle knuckle. Keep the knuckle at the tips of your fingers straight and try to touch the bottom of your palm. 3. Hold this position for __________ seconds. 4. Extend your fingers back to the starting position, stretching every joint fully. Repeat this exercise 5-10 times with each hand. Tabletop 1. Stand or sit with your arm, hand, and all five fingers pointed straight up. Make sure to keep your wrist straight during the exercise. 2. Gently bend your fingers at the big knuckle, where your fingers meet your hand, as far down as you can while keeping the small knuckles in your fingers straight. Think of forming a tabletop with your fingers. 3. Hold this position for __________ seconds. 4. Extend your fingers back to the starting position, stretching every joint fully. Repeat this exercise 5-10 times with each hand. Finger spread 1. Place your hand flat on a table with your palm facing down. Make sure your wrist stays straight as you do this exercise. 2. Spread your fingers and thumb apart from each other as far as you can until you feel a gentle stretch. Hold this position for __________ seconds. 3. Bring your fingers and thumb tight  together again. Hold this position for __________ seconds. Repeat this exercise 5-10 times with each hand. Making circles 1. Stand or sit with your arm, hand, and all five fingers pointed straight up. Make sure to keep your wrist straight during the exercise. 2. Make a circle by touching the tip of your thumb to the tip of your index finger. 3. Hold for __________ seconds. Then open your  hand wide. 4. Repeat this motion with your thumb and each finger on your hand. Repeat this exercise 5-10 times with each hand. Thumb motion 1. Sit with your forearm resting on a table and your wrist straight. Your thumb should be facing up toward the ceiling. Keep your fingers relaxed as you move your thumb. 2. Lift your thumb up as high as you can toward the ceiling. Hold for __________ seconds. 3. Bend your thumb across your palm as far as you can, reaching the tip of your thumb for the small finger (pinkie) side of your palm. Hold for __________ seconds. Repeat this exercise 5-10 times with each hand. Grip strengthening  1. Hold a stress ball or other soft ball in the middle of your hand. 2. Slowly increase the pressure, squeezing the ball as much as you can without causing pain. Think of bringing the tips of your fingers into the middle of your palm. All of your finger joints should bend when doing this exercise. 3. Hold your squeeze for __________ seconds, then relax. Repeat this exercise 5-10 times with each hand. Contact a health care provider if:  Your hand pain or discomfort gets much worse when you do an exercise.  Your hand pain or discomfort does not improve within 2 hours after you exercise. If you have any of these problems, stop doing these exercises right away. Do not do them again unless your health care provider says that you can. Get help right away if:  You develop sudden, severe hand pain or swelling. If this happens, stop doing these exercises right away. Do not do them again unless your health care provider says that you can. This information is not intended to replace advice given to you by your health care provider. Make sure you discuss any questions you have with your health care provider. Document Released: 02/07/2015 Document Revised: 06/19/2018 Document Reviewed: 02/27/2018 Elsevier Patient Education  2020 [employer].  Tennis Elbow Rehab Ask your health  care provider which exercises are safe for you. Do exercises exactly as told by your health care provider and adjust them as directed. It is normal to feel mild stretching, pulling, tightness, or discomfort as you do these exercises. Stop right away if you feel sudden pain or your pain gets worse. Do not begin these exercises until told by your health care provider. Stretching and range-of-motion exercises These exercises warm up your muscles and joints and improve the movement and flexibility of your elbow. These exercises also help to relieve pain, numbness, and tingling. Wrist flexion, assisted  1. Straighten your left / right elbow in front of you with your palm facing down toward the floor. ? If told by your health care provider, bend your left / right elbow to a 90-degree angle (right angle) at your side. 2. With your other hand, gently push over the back of your left / right hand so your fingers point toward the floor (flexion). Stop when you feel a gentle stretch on the back of your forearm. 3. Hold this position for __________ seconds. Repeat __________ times. Complete this  exercise __________ times a day. Wrist extension, assisted  1. Straighten your left / right elbow in front of you with your palm facing up toward the ceiling. ? If told by your health care provider, bend your left / right elbow to a 90-degree angle (right angle) at your side. 2. With your other hand, gently pull your left / right hand and fingers toward the floor (extension). Stop when you feel a gentle stretch on the palm side of your forearm. 3. Hold this position for __________ seconds. Repeat __________ times. Complete this exercise __________ times a day. Assisted forearm rotation, supination 5. Sit or stand with your left / right elbow bent to a 90-degree angle (right angle) at your side. 6. Using your uninjured hand, turn (rotate) your left / right palm up toward the ceiling (supination) until you feel a gentle  stretch along the inside of your forearm. 7. Hold this position for __________ seconds. Repeat __________ times. Complete this exercise __________ times a day. Assisted forearm rotation, pronation 5. Sit or stand with your left / right elbow bent to a 90-degree angle (right angle) at your side. 6. Using your uninjured hand, rotate your left / right palm down toward the floor (pronation) until you feel a gentle stretch along the outside of your forearm. 7. Hold this position for __________ seconds. Repeat __________ times. Complete this exercise __________ times a day. Strengthening exercises These exercises build strength and endurance in your forearm and elbow. Endurance is the ability to use your muscles for a long time, even after they get tired. Radial deviation  1. Stand with a __________ weight or a hammer in your left / right hand. Or, sit while holding a rubber exercise band or tubing, with your left / right forearm supported on a table or countertop. ? If you are standing, position your forearm so that your thumb is facing forward. If you are sitting, position your forearm so that the thumb is facing the ceiling. This is the neutral position. 2. Raise your hand upward in front of you so your thumb moves toward the ceiling (radial deviation), or pull up on the rubber tubing. Keep your forearm and elbow still while you move your wrist only. 3. Hold this position for __________ seconds. 4. Slowly return to the starting position. Repeat __________ times. Complete this exercise __________ times a day. Wrist extension, eccentric 1. Sit with your left / right forearm palm-down and supported on a table or other surface. Let your left / right wrist extend over the edge of the surface. 2. Hold a __________ weight or a piece of exercise band or tubing in your left / right hand. ? If using a rubber exercise band or tubing, hold the other end of the tubing with your other hand. 3. Use your  uninjured hand to move your left / right hand up toward the ceiling. 4. Take your uninjured hand away and slowly return to the starting position using only your left / right hand. Lowering your arm under tension is called eccentric extension. Repeat __________ times. Complete this exercise __________ times a day. Wrist extension Do not do this exercise if it causes pain at the outside of your elbow. Only do this exercise once instructed by your health care provider. 1. Sit with your left / right forearm supported on a table or other surface and your palm turned down toward the floor. Let your left / right wrist extend over the edge of the surface. 2. Hold  a __________ weight or a piece of rubber exercise band or tubing. ? If you are using a rubber exercise band or tubing, hold the band or tubing in place with your other hand to provide resistance. 3. Slowly bend your wrist so your hand moves up toward the ceiling (extension). Move only your wrist, keeping your forearm and elbow still. 4. Hold this position for __________ seconds. 5. Slowly return to the starting position. Repeat __________ times. Complete this exercise __________ times a day. Forearm rotation, supination To do this exercise, you will need a lightweight hammer or rubber mallet. 1. Sit with your left / right forearm supported on a table or other surface. Bend your elbow to a 90-degree angle (right angle). Position your forearm so that your palm is facing down toward the floor, with your hand resting over the edge of the table. 2. Hold a hammer in your left / right hand. ? To make this exercise easier, hold the hammer near the head of the hammer. ? To make this exercise harder, hold the hammer near the end of the handle. 3. Without moving your wrist or elbow, slowly rotate your forearm so your palm faces up toward the ceiling (supination). 4. Hold this position for __________ seconds. 5. Slowly return to the starting position.  Repeat __________ times. Complete this exercise __________ times a day. Shoulder blade squeeze 1. Sit in a stable chair or stand with good posture. If you are sitting down, do not let your back touch the back of the chair. 2. Your arms should be at your sides with your elbows bent to a 90-degree angle (right angle). Position your forearms so that your thumbs are facing the ceiling (neutral position). 3. Without lifting your shoulders up, squeeze your shoulder blades tightly together. 4. Hold this position for __________ seconds. 5. Slowly release and return to the starting position. Repeat __________ times. Complete this exercise __________ times a day. This information is not intended to replace advice given to you by your health care provider. Make sure you discuss any questions you have with your health care provider. Document Released: 02/26/2005 Document Revised: 06/19/2018 Document Reviewed: 04/22/2018 Elsevier Patient Education  2020 [employer]

## 2018-10-23 ENCOUNTER — Other Ambulatory Visit: Payer: Self-pay | Admitting: Obstetrics & Gynecology

## 2018-10-23 DIAGNOSIS — R928 Other abnormal and inconclusive findings on diagnostic imaging of breast: Secondary | ICD-10-CM

## 2018-10-23 LAB — CBC WITH DIFFERENTIAL/PLATELET
Absolute Monocytes: 389 cells/uL (ref 200–950)
Basophils Absolute: 43 cells/uL (ref 0–200)
Basophils Relative: 0.6 %
Eosinophils Absolute: 151 cells/uL (ref 15–500)
Eosinophils Relative: 2.1 %
HCT: 39.7 % (ref 35.0–45.0)
Hemoglobin: 12.6 g/dL (ref 11.7–15.5)
Lymphs Abs: 1440 cells/uL (ref 850–3900)
MCH: 24.5 pg — ABNORMAL LOW (ref 27.0–33.0)
MCHC: 31.7 g/dL — ABNORMAL LOW (ref 32.0–36.0)
MCV: 77.2 fL — ABNORMAL LOW (ref 80.0–100.0)
MPV: 8.7 fL (ref 7.5–12.5)
Monocytes Relative: 5.4 %
Neutro Abs: 5177 cells/uL (ref 1500–7800)
Neutrophils Relative %: 71.9 %
Platelets: 318 10*3/uL (ref 140–400)
RBC: 5.14 10*6/uL — ABNORMAL HIGH (ref 3.80–5.10)
RDW: 16 % — ABNORMAL HIGH (ref 11.0–15.0)
Total Lymphocyte: 20 %
WBC: 7.2 10*3/uL (ref 3.8–10.8)

## 2018-10-23 LAB — COMPLETE METABOLIC PANEL WITH GFR
AG Ratio: 1.3 (calc) (ref 1.0–2.5)
ALT: 17 U/L (ref 6–29)
AST: 18 U/L (ref 10–30)
Albumin: 3.9 g/dL (ref 3.6–5.1)
Alkaline phosphatase (APISO): 66 U/L (ref 31–125)
BUN: 11 mg/dL (ref 7–25)
CO2: 25 mmol/L (ref 20–32)
Calcium: 9.1 mg/dL (ref 8.6–10.2)
Chloride: 106 mmol/L (ref 98–110)
Creat: 0.88 mg/dL (ref 0.50–1.10)
GFR, Est African American: 94 mL/min/{1.73_m2} (ref >=60)
GFR, Est Non African American: 81 mL/min/{1.73_m2} (ref >=60)
Globulin: 3 g/dL (calc) (ref 1.9–3.7)
Glucose, Bld: 113 mg/dL — ABNORMAL HIGH (ref 65–99)
Potassium: 4.6 mmol/L (ref 3.5–5.3)
Sodium: 139 mmol/L (ref 135–146)
Total Bilirubin: 0.6 mg/dL (ref 0.2–1.2)
Total Protein: 6.9 g/dL (ref 6.1–8.1)

## 2018-10-23 NOTE — Progress Notes (Signed)
RBC count is borderline elevated-5.14.  MCV, MCH, MCHC is low but stable. We will continue to monitor Glucose is elevated-113.  Rest of CMP WNL

## 2018-10-29 ENCOUNTER — Other Ambulatory Visit: Payer: Self-pay

## 2018-10-29 ENCOUNTER — Ambulatory Visit: Payer: Managed Care, Other (non HMO)

## 2018-10-29 ENCOUNTER — Ambulatory Visit
Admission: RE | Admit: 2018-10-29 | Discharge: 2018-10-29 | Disposition: A | Discharge status: Home / Self Care | Payer: Managed Care, Other (non HMO) | Source: Ambulatory Visit | Attending: Obstetrics & Gynecology | Admitting: Obstetrics & Gynecology

## 2018-10-29 DIAGNOSIS — R928 Other abnormal and inconclusive findings on diagnostic imaging of breast: Secondary | ICD-10-CM

## 2018-11-03 ENCOUNTER — Encounter: Payer: Self-pay | Admitting: Internal Medicine

## 2018-11-03 ENCOUNTER — Other Ambulatory Visit: Payer: Self-pay

## 2018-11-03 ENCOUNTER — Telehealth: Payer: Self-pay | Admitting: *Deleted

## 2018-11-03 ENCOUNTER — Ambulatory Visit: Payer: Managed Care, Other (non HMO) | Admitting: Internal Medicine

## 2018-11-03 VITALS — BP 126/82 | HR 82 | Ht 70.0 in | Wt 355.0 lb

## 2018-11-03 DIAGNOSIS — R002 Palpitations: Secondary | ICD-10-CM | POA: Diagnosis not present

## 2018-11-03 DIAGNOSIS — R0602 Shortness of breath: Secondary | ICD-10-CM

## 2018-11-03 NOTE — Patient Instructions (Signed)
Medication Instructions:  No changes If you need a refill on your cardiac medications before your next appointment, please call your pharmacy.   Lab work: none If you have labs (blood work) drawn today and your tests are completely normal, you will receive your results only by: Marland Kitchen MyChart Message (if you have MyChart) OR . A paper copy in the mail If you have any lab test that is abnormal or we need to change your treatment, we will call you to review the results.  Testing/Procedures: Your physician has recommended that you wear a long term monitor. Heart monitors are medical devices that record the heart's electrical activity. Doctors most often use these monitors to diagnose arrhythmias. Arrhythmias are problems with the speed or rhythm of the heartbeat. The monitor is a small, portable device. You can wear one while you do your normal daily activities. This is usually used to diagnose what is causing palpitations/syncope (passing out).  This will be mailed to you, is to be worn for 3 days and then mailed back.   Your physician has requested that you have an echocardiogram. Echocardiography is a painless test that uses sound waves to create images of your heart. It provides your doctor with information about the size and shape of your heart and how well your heart's chambers and valves are working. This procedure takes approximately one hour. There are no restrictions for this procedure.   Follow-Up: Follow up with your physician will depend on test results.  Any Other Special Instructions Will Be Listed Below (If Applicable).

## 2018-11-03 NOTE — Progress Notes (Signed)
Cardiology Office Note   Date:  11/03/2018   ID:  Jaiden, Wahab Oct 30, 1975, MRN [id number]  PCP:  Jarrett Soho, PA-C  Renee Howard  Cardiologist:   Dietrich Pates, MD   Pt referred by [facility] for SOB with exertion.      History of Present Illness: Renee Howard is a 43 y.o. female with a history of  Patient seen by C Duane Lope   Noted SOB with activity Pt says with activity,her breathing is shorter now than 1year ago   Denies CP  She also notes with activity her heart rate goes up significantly   Even withwalking   SHe has been placed on Coreg and this has helpt some  Last time she felt  OK was last summer     Hx of superfiicial venous thrombosis in 2005   ON ASA for awhile   None now.    No syncope    No presyncope     Current Meds  Medication Sig  . acetaminophen (TYLENOL) 500 MG tablet Take 1,000 mg by mouth every 6 (six) hours as needed. For pain  . carvedilol (COREG) 3.125 MG tablet Take 3.125 mg by mouth daily.   Marland Kitchen escitalopram (LEXAPRO) 10 MG tablet Take 10 mg by mouth daily.   . hydroxychloroquine (PLAQUENIL) 200 MG tablet TAKE 1 TABLET BY MOUTH TWICE DAILY, Monday through Friday only.  Marland Kitchen omeprazole (PRILOSEC) 40 MG capsule TAKE 1 CAPSULE BY MOUTH DAILY BEFORE BREAKFAST     Allergies:   Other   Past Medical History:  Diagnosis Date  . Anticardiolipin antibody positive 06/18/2016  . Anxiety   . Arm pain, left 09/03/2014  . Arthritis   . Carpal tunnel syndrome 09/03/2014  . Cervical radiculitis 09/03/2014  . Depression   . DVT (deep venous thrombosis) (HCC)   . Dyspnea on exertion   . Elevated beta-2  06/18/2016  . Gait disturbance 09/03/2014  . Low back pain   . No pertinent past medical history   . Numbness 09/03/2014  . PONV (postoperative nausea and vomiting)   . Primary osteoarthritis of both hands 12/26/2016  . Rheumatoid arthritis involving multiple sites with positive rheumatoid factor (HCC) 09/03/2014  . Urinary frequency 09/03/2014     Past Surgical History:  Procedure Laterality Date  . CESAREAN SECTION     2009  . HERNIA REPAIR     1978     Social History:  The patient  reports that she has never smoked. She has never used smokeless tobacco. She reports that she does not drink alcohol or use drugs.   Family History:  The patient's family history includes Asperger's syndrome in her brother; Cancer - Ovarian in her paternal grandmother; Gallbladder disease in her mother; Gallstones in her father; Healthy in her son; Hypertension in her father.    ROS:  Please see the history of present illness. All other systems are reviewed and  Negative to the above problem except as noted.    PHYSICAL EXAM: VS:  BP 126/82   Pulse 82   Ht 5\' 10"  (1.778 m)   Wt (!) 355 lb (161 kg)   SpO2 97%   BMI 50.94 kg/m   Orhtostatics:  123/71  P 81  Sitting 111/71 P 85  Standing 2 min 113/74 P 96   Standing 4 min 119/77  P 94   GEN:  Morbidly obese 43 yo, in no acute distress  HEENT: normal  Neck: no JVD, carotid bruits,  or masses Cardiac: RRR; no murmurs, rubs, or gallops,no edema  Respiratory:  clear to auscultation bilaterally, normal work of breathing GI: soft, nontender, nondistended, + BS  No hepatomegaly  MS: no deformity Moving all extremities   Skin: warm and dry, no rash Neuro:  Strength and sensation are intact Psych: euthymic mood, full affect   EKG:  EKG is not ordered today. On 07/24/18:  SR 84 bpm   Lipid Panel No results found for: CHOL, TRIG, HDL, CHOLHDL, VLDL, LDLCALC, LDLDIRECT    Wt Readings from Last 3 Encounters:  11/03/18 (!) 355 lb (161 kg)  10/22/18 (!) 353 lb (160.1 kg)  05/19/18 (!) 362 lb 3.2 oz (164.3 kg)      ASSESSMENT AND PLAN:  1  Dyspnea.   Pt notes progressive SOBover past year   No evid of CHF on exam As initial step,  I would recomm echo to evaluate    2 Palpitations    Pt notes heart racing (sounds like SR/ST) with normal activity.   Will set up for holter/event  Continue meds     She is not orthoststatic on exam  I would recomm ample fluid intake  3  Morbid obesity   Needs to lose wt     Current medicines are reviewed at length with the patient today.  The patient does not have concerns regarding medicines.  Signed, Dorris Carnes, MD  11/03/2018 1:51 PM    [facility] [address], [place], [place]  [address] Phone: [phone]; Fax: [phone]

## 2018-11-03 NOTE — Telephone Encounter (Signed)
3 day ZIO XT long term holter monitor to be mailed to patients home.  Insdtructions reviewed briefly as they are included in the monitor kit.

## 2018-11-06 ENCOUNTER — Ambulatory Visit (HOSPITAL_COMMUNITY): Payer: Managed Care, Other (non HMO) | Attending: Cardiology

## 2018-11-06 ENCOUNTER — Other Ambulatory Visit: Payer: Self-pay

## 2018-11-06 ENCOUNTER — Telehealth: Payer: Self-pay

## 2018-11-06 DIAGNOSIS — R002 Palpitations: Secondary | ICD-10-CM | POA: Diagnosis present

## 2018-11-06 DIAGNOSIS — R0602 Shortness of breath: Secondary | ICD-10-CM | POA: Diagnosis present

## 2018-11-06 MED ORDER — PERFLUTREN LIPID MICROSPHERE
1.0000 mL | INTRAVENOUS | Status: AC | PRN
  Administered 2018-11-06: 09:00:00 2 mL via INTRAVENOUS

## 2018-11-06 NOTE — Telephone Encounter (Signed)
The patient has been notified of the result and verbalized understanding.  All questions (if any) were answered. Wilma Flavin, RN 11/06/2018 1:30 PM

## 2018-11-09 ENCOUNTER — Other Ambulatory Visit (INDEPENDENT_AMBULATORY_CARE_PROVIDER_SITE_OTHER): Payer: Managed Care, Other (non HMO)

## 2018-11-09 DIAGNOSIS — R0602 Shortness of breath: Secondary | ICD-10-CM | POA: Diagnosis not present

## 2018-11-09 DIAGNOSIS — R002 Palpitations: Secondary | ICD-10-CM | POA: Diagnosis not present

## 2018-12-04 ENCOUNTER — Telehealth: Payer: Self-pay | Admitting: *Deleted

## 2018-12-04 MED ORDER — CARVEDILOL 3.125 MG PO TABS: ORAL_TABLET | ORAL | 6 refills | Status: DC

## 2018-12-04 NOTE — Telephone Encounter (Signed)
Spoke with patient and informed of results/review and recommendations by Dr. Harrington Challenger. She will increase carvedilol to 6.25 mg in am and keep 3.125 mg for pm dose and will call or use MyChart to update on her symptoms.  New prescription sent to [facility].  Pt thanked me for call.

## 2018-12-04 NOTE — Telephone Encounter (Signed)
-----   Message from [place], MD sent at 11/23/2018  9:46 PM EDT ----- Sinus rhythm  Average HR 82 bpm No arrhythmias detected Pt could go up on carvedilol to 6.25 in am; 3.125 in pm   Can increase further if still having symptoms

## 2019-03-25 ENCOUNTER — Ambulatory Visit: Payer: Managed Care, Other (non HMO) | Admitting: Rheumatology

## 2019-05-07 NOTE — Progress Notes (Signed)
Office Visit Note  Patient: Renee Howard             Date of Birth: 09/15/1975           MRN: [id number]             PCP: Jarrett Soho, PA-C Referring: Jarrett Soho, PA-C Visit Date: 05/13/2019 Occupation: @GUAROCC @  Subjective:  Increased joint pain and swelling.   History of Present Illness: Renee Howard is a 45 y.o. female with history of rheumatoid arthritis.  She has been noticing increased pain and swelling in her hands recently.  She has been using Voltaren gel on a regular basis.  She has been also having discomfort in her right knee joint.  He is having difficulty lifting her right arm due to discomfort in her clavicle per patient.  She has been experiencing increased itching all over her body.  She [facility]one taper in November 2020.  She initially had a rash which resolved after the prednisone taper but had no recurrence of the rash.  Activities of Daily Living:  Patient reports morning stiffness for 10 minutes.   Patient Reports nocturnal pain.  Difficulty dressing/grooming: Reports Difficulty climbing stairs: Reports Difficulty getting out of chair: Reports Difficulty using hands for taps, buttons, cutlery, and/or writing: Reports  Review of Systems  Constitutional: Positive for fatigue. Negative for night sweats, weight gain and weight loss.  HENT: Positive for mouth dryness. Negative for mouth sores, trouble swallowing, trouble swallowing and nose dryness.   Eyes: Positive for dryness. Negative for pain, redness and visual disturbance.  Respiratory: Positive for shortness of breath. Negative for cough and difficulty breathing.   Cardiovascular: Negative for chest pain, palpitations, hypertension, irregular heartbeat and swelling in legs/feet.  Gastrointestinal: Negative for blood in stool, constipation and diarrhea.  Endocrine: Negative for increased urination.  Genitourinary:  Negative for difficulty urinating, painful urination and vaginal dryness.  Musculoskeletal: Positive for arthralgias, joint pain and morning stiffness. Negative for joint swelling, myalgias, muscle weakness, muscle tenderness and myalgias.  Skin: Positive for rash. Negative for color change, hair loss, skin tightness, ulcers and sensitivity to sunlight.  Allergic/Immunologic: Negative for susceptible to infections.  Neurological: Positive for headaches. Negative for dizziness, numbness, memory loss, night sweats and weakness.  Hematological: Negative for bruising/bleeding tendency and swollen glands.  Psychiatric/Behavioral: Negative for depressed mood, confusion and sleep disturbance. The patient is not nervous/anxious.     PMFS History:  Patient Active Problem List   Diagnosis Date Noted  . Primary osteoarthritis of both hands 12/26/2016  . High risk medication use 06/28/2016  . Anticardiolipin antibody positive 06/18/2016  . Elevated beta-2  06/18/2016  . Numbness 09/03/2014  . Urinary frequency 09/03/2014  . Gait disturbance 09/03/2014  . Arm pain, left 09/03/2014  . Carpal tunnel syndrome 09/03/2014  . Cervical radiculitis 09/03/2014  . Rheumatoid arthritis involving multiple sites with positive rheumatoid factor (HCC) 09/03/2014    Past Medical History:  Diagnosis Date  . Anticardiolipin antibody positive 06/18/2016  . Anxiety   . Arm pain, left 09/03/2014  . Arthritis   . Carpal tunnel syndrome 09/03/2014  . Cervical radiculitis 09/03/2014  . Depression   . DVT (deep venous thrombosis) (HCC)   . Dyspnea on exertion   . Elevated beta-2  06/18/2016  . Gait disturbance 09/03/2014  . Low back pain   . No pertinent past medical history   . Numbness 09/03/2014  .  PONV (postoperative nausea and vomiting)   . Primary osteoarthritis of both hands 12/26/2016  . Rheumatoid arthritis involving multiple sites with positive rheumatoid factor ([place]) 09/03/2014  . Urinary frequency 09/03/2014      Family History  Problem Relation Age of Onset  . Gallbladder disease Mother   . Gallstones Father   . Hypertension Father   . Asperger's syndrome Brother   . Healthy Son   . Cancer - Ovarian Paternal Grandmother    Past Surgical History:  Procedure Laterality Date  . CESAREAN SECTION     2009  . [place]   Social History   Social History Narrative  . Not on file    There is no immunization history on file for this patient.   Objective: Vital Signs: BP 125/77 (BP Location: Left Wrist, Patient Position: Sitting, Cuff Size: Normal)   Pulse 77   Resp 17   Ht 5\' 10"  (1.778 m)   Wt (!) 366 lb (166 kg)   BMI 52.52 kg/m    Physical Exam Vitals and nursing note reviewed.  Constitutional:      Appearance: She is well-developed.  HENT:     Head: Normocephalic and atraumatic.  Eyes:     Conjunctiva/sclera: Conjunctivae normal.  Cardiovascular:     Rate and Rhythm: Normal rate and regular rhythm.     Heart sounds: Normal heart sounds.  Pulmonary:     Effort: Pulmonary effort is normal.     Breath sounds: Normal breath sounds.  Abdominal:     General: Bowel sounds are normal.     Palpations: Abdomen is soft.  Musculoskeletal:     Cervical back: Normal range of motion.  Lymphadenopathy:     Cervical: No cervical adenopathy.  Skin:    General: Skin is warm and dry.     Capillary Refill: Capillary refill takes less than 2 seconds.  Neurological:     Mental Status: She is alert and oriented to person, place, and time.  Psychiatric:        Behavior: Behavior normal.      Musculoskeletal Exam: C-spine thoracic and lumbar spine with good range of motion.  Shoulder joints, elbow joints, wrist joints with good range of motion.  She has no tenderness on palpation over wrist joints, MCPs, PIPs and DIPs.  Hip joints, knee joints, ankles, MTPs PIPs with good range of motion.  No warmth swelling or effusion was noted in the knee joints.  CDAI Exam: CDAI  Score: 0.6  Patient Global: 4 mm; Provider Global: 2 mm Swollen: 0 ; Tender: 0  Joint Exam 05/13/2019   No joint exam has been documented for this visit   There is currently no information documented on the homunculus. Go to the Rheumatology activity and complete the homunculus joint exam.  Investigation: No additional findings.  Imaging: No results found.  Recent Labs: Lab Results  Component Value Date   WBC 7.2 10/22/2018   HGB 12.6 10/22/2018   PLT 318 10/22/2018   NA 139 10/22/2018   K 4.6 10/22/2018   CL 106 10/22/2018   CO2 25 10/22/2018   GLUCOSE 113 (H) 10/22/2018   BUN 11 10/22/2018   CREATININE 0.88 10/22/2018   BILITOT 0.6 10/22/2018   ALKPHOS 70 10/06/2013   AST 18 10/22/2018   ALT 17 10/22/2018   PROT 6.9 10/22/2018   ALBUMIN 3.9 10/06/2013   CALCIUM 9.1 10/22/2018   GFRAA 94 10/22/2018    Speciality Comments: PLQ eye  exam: 07/02/2017 Normal. [facility]. Follow up in 6 months.  Procedures:  No procedures performed Allergies: Other   Assessment / Plan:     Visit Diagnoses: Rheumatoid arthritis involving multiple sites with positive rheumatoid factor (HCC) - Positive RF, positive ANA -patient has been complaining of increased pain and discomfort in her hands and her right knee.  No warmth swelling or effusion was noted.  I offered to schedule an ultrasound which she declined.  Plan: Sedimentation rate  High risk medication use - PLQ 200 mg by mouth BID M-F. eye exam: 07/02/2017  - Plan: CBC with Differential/Platelet, COMPLETE METABOLIC PANEL WITH GFR today and then in 5 months.  Pain in both hands -she has been experiencing increased pain and discomfort in her bilateral hands.  No synovitis was noted.  Plan: XR Hand 2 View Right, XR Hand 2 View Left hand x-ray showed only mild osteoarthritic changes.  Chronic pain of right knee -she has discomfort in her right knee joint.  No warmth swelling or effusion was noted.  Plan: XR KNEE 3 VIEW RIGHT.   X-ray showed mild osteoarthritis and mild chondromalacia patella.  She will benefit from weight loss and exercise.  Anticardiolipin antibody positive - Negative in 2018   Elevated beta-2  - Negative in 2018   Primary osteoarthritis of both hands  Carpal tunnel syndrome of left wrist - Resolved   Lateral epicondylitis, right elbow-she has off-and-on discomfort.  She states she has done some work modifications.  Cervical radiculitis  History of depression - She is taking Lexapro as prescribed.   Orders: Orders Placed This Encounter  Procedures  . XR Hand 2 View Right  . XR Hand 2 View Left  . XR KNEE 3 VIEW RIGHT  . CBC with Differential/Platelet  . COMPLETE METABOLIC PANEL WITH GFR  . Sedimentation rate   No orders of the defined types were placed in this encounter.     Follow-Up Instructions: Return for Rheumatoid arthritis.   Pollyann Savoy, MD  Note - This record has been created using Animal nutritionist.  Chart creation errors have been sought, but may not always  have been located. Such creation errors do not reflect on  the standard of medical care.

## 2019-05-13 ENCOUNTER — Other Ambulatory Visit: Payer: Self-pay

## 2019-05-13 ENCOUNTER — Ambulatory Visit: Payer: Self-pay

## 2019-05-13 ENCOUNTER — Encounter: Payer: Self-pay | Admitting: Physician Assistant

## 2019-05-13 ENCOUNTER — Ambulatory Visit: Payer: Managed Care, Other (non HMO) | Admitting: Rheumatology

## 2019-05-13 VITALS — BP 125/77 | HR 77 | Resp 17 | Ht 70.0 in | Wt 366.0 lb

## 2019-05-13 DIAGNOSIS — M0579 Rheumatoid arthritis with rheumatoid factor of multiple sites without organ or systems involvement: Secondary | ICD-10-CM | POA: Diagnosis not present

## 2019-05-13 DIAGNOSIS — G5602 Carpal tunnel syndrome, left upper limb: Secondary | ICD-10-CM

## 2019-05-13 DIAGNOSIS — M79641 Pain in right hand: Secondary | ICD-10-CM | POA: Diagnosis not present

## 2019-05-13 DIAGNOSIS — Z79899 Other long term (current) drug therapy: Secondary | ICD-10-CM | POA: Diagnosis not present

## 2019-05-13 DIAGNOSIS — M19041 Primary osteoarthritis, right hand: Secondary | ICD-10-CM

## 2019-05-13 DIAGNOSIS — R7989 Other specified abnormal findings of blood chemistry: Secondary | ICD-10-CM

## 2019-05-13 DIAGNOSIS — M25561 Pain in right knee: Secondary | ICD-10-CM

## 2019-05-13 DIAGNOSIS — M5412 Radiculopathy, cervical region: Secondary | ICD-10-CM

## 2019-05-13 DIAGNOSIS — Z8659 Personal history of other mental and behavioral disorders: Secondary | ICD-10-CM

## 2019-05-13 DIAGNOSIS — G8929 Other chronic pain: Secondary | ICD-10-CM

## 2019-05-13 DIAGNOSIS — M79642 Pain in left hand: Secondary | ICD-10-CM

## 2019-05-13 DIAGNOSIS — M19042 Primary osteoarthritis, left hand: Secondary | ICD-10-CM

## 2019-05-13 DIAGNOSIS — R760 Raised antibody titer: Secondary | ICD-10-CM

## 2019-05-13 DIAGNOSIS — M7711 Lateral epicondylitis, right elbow: Secondary | ICD-10-CM

## 2019-05-13 LAB — COMPLETE METABOLIC PANEL WITH GFR
AG Ratio: 1.4 (calc) (ref 1.0–2.5)
ALT: 19 U/L (ref 6–29)
AST: 16 U/L (ref 10–30)
Albumin: 4 g/dL (ref 3.6–5.1)
Alkaline phosphatase (APISO): 74 U/L (ref 31–125)
BUN: 13 mg/dL (ref 7–25)
CO2: 24 mmol/L (ref 20–32)
Calcium: 9 mg/dL (ref 8.6–10.2)
Chloride: 106 mmol/L (ref 98–110)
Creat: 0.76 mg/dL (ref 0.50–1.10)
GFR, Est African American: 111 mL/min/{1.73_m2} (ref >=60)
GFR, Est Non African American: 96 mL/min/{1.73_m2} (ref >=60)
Globulin: 2.8 g/dL (calc) (ref 1.9–3.7)
Glucose, Bld: 103 mg/dL — ABNORMAL HIGH (ref 65–99)
Potassium: 4.6 mmol/L (ref 3.5–5.3)
Sodium: 138 mmol/L (ref 135–146)
Total Bilirubin: 0.5 mg/dL (ref 0.2–1.2)
Total Protein: 6.8 g/dL (ref 6.1–8.1)

## 2019-05-13 LAB — CBC WITH DIFFERENTIAL/PLATELET
Absolute Monocytes: 389 cells/uL (ref 200–950)
Basophils Absolute: 52 cells/uL (ref 0–200)
Basophils Relative: 0.9 %
Eosinophils Absolute: 180 cells/uL (ref 15–500)
Eosinophils Relative: 3.1 %
HCT: 37.5 % (ref 35.0–45.0)
Hemoglobin: 12.3 g/dL (ref 11.7–15.5)
Lymphs Abs: 1450 cells/uL (ref 850–3900)
MCH: 25.7 pg — ABNORMAL LOW (ref 27.0–33.0)
MCHC: 32.8 g/dL (ref 32.0–36.0)
MCV: 78.3 fL — ABNORMAL LOW (ref 80.0–100.0)
MPV: 8.9 fL (ref 7.5–12.5)
Monocytes Relative: 6.7 %
Neutro Abs: 3729 cells/uL (ref 1500–7800)
Neutrophils Relative %: 64.3 %
Platelets: 362 10*3/uL (ref 140–400)
RBC: 4.79 10*6/uL (ref 3.80–5.10)
RDW: 14.5 % (ref 11.0–15.0)
Total Lymphocyte: 25 %
WBC: 5.8 10*3/uL (ref 3.8–10.8)

## 2019-05-13 LAB — SEDIMENTATION RATE: Sed Rate: 19 mm/h (ref 0–20)

## 2019-05-14 NOTE — Progress Notes (Signed)
CBC and CMP are stable.  Sed rate is normal.

## 2019-08-19 ENCOUNTER — Other Ambulatory Visit: Payer: Self-pay | Admitting: Rheumatology

## 2019-08-19 DIAGNOSIS — M0579 Rheumatoid arthritis with rheumatoid factor of multiple sites without organ or systems involvement: Secondary | ICD-10-CM

## 2019-08-19 NOTE — Telephone Encounter (Signed)
Patient needs eye examination prior to refill of Plaquenil.  Its been over 2 years since her last eye examination.

## 2019-08-19 NOTE — Telephone Encounter (Signed)
Last Visit: 05/13/2019 Next Visit: 11/11/2019 Labs: 05/13/2019 CBC and CMP are stable. Eye exam: 07/02/2017 Normal.  Current Dose per office note 05/13/2019:  PLQ 200 mg by mouth BID M-F  Attempted to contact the patient and left message to advise patient she is due to update PLQ eye exam.   Okay to refill Plaquenil?

## 2019-08-25 ENCOUNTER — Telehealth: Payer: Self-pay | Admitting: Rheumatology

## 2019-08-25 NOTE — Telephone Encounter (Signed)
Patient called stating her Plaquenil eye exam is scheduled for 10/14/19 at [facility] in [facility].

## 2019-08-25 NOTE — Telephone Encounter (Signed)
Noted  

## 2019-10-05 ENCOUNTER — Telehealth: Payer: Self-pay | Admitting: Rheumatology

## 2019-10-05 DIAGNOSIS — M0579 Rheumatoid arthritis with rheumatoid factor of multiple sites without organ or systems involvement: Secondary | ICD-10-CM

## 2019-10-05 MED ORDER — HYDROXYCHLOROQUINE SULFATE 200 MG PO TABS: ORAL_TABLET | ORAL | 0 refills | Status: DC

## 2019-10-05 NOTE — Telephone Encounter (Signed)
Patient left a voicemail requesting prescription refill of Hydroxychloroquine.    

## 2019-10-05 NOTE — Telephone Encounter (Signed)
Last Visit: 05/13/2019 Next Visit: 11/11/2019 Labs: 05/13/2019 CBC and CMP are stable. Eye exam: 07/02/2017 Normal.  Patient advised she is due to update PLQ eye exam. Patient states she has an appointment to update her PLQ eye exam on 10/14/2019.   Current Dose per office note 05/13/2019: PLQ 200 mg by mouth BID M-F  Okay to refill PLQ?

## 2019-10-28 ENCOUNTER — Other Ambulatory Visit: Payer: Self-pay | Admitting: Physician Assistant

## 2019-10-28 DIAGNOSIS — M0579 Rheumatoid arthritis with rheumatoid factor of multiple sites without organ or systems involvement: Secondary | ICD-10-CM

## 2019-10-28 NOTE — Telephone Encounter (Signed)
Last Visit: 05/13/2019 Next Visit: 11/11/2019 Labs: 05/13/2019 CBC and CMP are stable. Eye exam: 10/14/2019 WNL  Current Dose per office note 05/13/2019: PLQ 200 mg by mouth BID M-F  Okay to refill per Dr. Corliss Skains

## 2019-11-06 NOTE — Progress Notes (Signed)
Office Visit Note  Patient: Renee Howard             Date of Birth: 12/06/1975           MRN: [id number]             PCP: Jarrett Soho, PA-C Referring: Jarrett Soho, PA-C Visit Date: 11/19/2019 Occupation: @GUAROCC @  Subjective:  Medication monitoring   History of Present Illness: Renee Howard is a 44 y.o. female with history of seropositive rheumatoid arthritis and osteoarthritis.  Patient is taking Plaquenil 200 mg 1 tablet by mouth twice daily.  She continues to tolerate Plaquenil and has not missed any doses recently.  She states that a couple months ago she ran out of her prescription which was not refilled due to her needing to update her Plaquenil eye exam.  She states that she was experiencing increased pain and intermittent inflammation during that time.  She has occasional pain in both hands, both knees, both ankles, and both feet.  She denies any joint swelling at this time.  She does not want to make any medication changes at this time. She denies any recent infections.  She has received both COVID-19 vaccinations.   Activities of Daily Living:  Patient reports morning stiffness for 5-10 minutes.   Patient Reports nocturnal pain.  Difficulty dressing/grooming: Denies Difficulty climbing stairs: Reports Difficulty getting out of chair: Reports Difficulty using hands for taps, buttons, cutlery, and/or writing: Denies  Review of Systems  Constitutional: Positive for fatigue.  HENT: Negative for mouth sores, mouth dryness and nose dryness.   Eyes: Positive for dryness. Negative for pain and itching.  Respiratory: Positive for shortness of breath. Negative for difficulty breathing.   Cardiovascular: Negative for chest pain and palpitations.  Gastrointestinal: Negative for blood in stool, constipation and diarrhea.  Endocrine: Positive for increased urination.  Genitourinary: Negative for difficulty urinating.  Musculoskeletal: Positive for arthralgias,  joint pain and morning stiffness. Negative for joint swelling, myalgias, muscle tenderness and myalgias.  Skin: Negative for color change, rash and redness.  Allergic/Immunologic: Negative for susceptible to infections.  Neurological: Positive for dizziness, numbness, headaches, memory loss and weakness.  Hematological: Positive for bruising/bleeding tendency.  Psychiatric/Behavioral: Negative for confusion.    PMFS History:  Patient Active Problem List   Diagnosis Date Noted  . Primary osteoarthritis of both hands 12/26/2016  . High risk medication use 06/28/2016  . Anticardiolipin antibody positive 06/18/2016  . Elevated beta-2  06/18/2016  . Numbness 09/03/2014  . Urinary frequency 09/03/2014  . Gait disturbance 09/03/2014  . Arm pain, left 09/03/2014  . Carpal tunnel syndrome 09/03/2014  . Cervical radiculitis 09/03/2014  . Rheumatoid arthritis involving multiple sites with positive rheumatoid factor (HCC) 09/03/2014    Past Medical History:  Diagnosis Date  . Anticardiolipin antibody positive 06/18/2016  . Anxiety   . Arm pain, left 09/03/2014  . Arthritis   . Carpal tunnel syndrome 09/03/2014  . Cervical radiculitis 09/03/2014  . Depression   . DVT (deep venous thrombosis) (HCC)   . Dyspnea on exertion   . Elevated beta-2  06/18/2016  . Gait disturbance 09/03/2014  . Low back pain   . No pertinent past medical history   . Numbness 09/03/2014  . PONV (postoperative nausea and vomiting)   . Primary osteoarthritis of both hands 12/26/2016  . Rheumatoid arthritis involving multiple sites with positive rheumatoid factor (HCC) 09/03/2014  . Urinary frequency 09/03/2014    Family History  Problem Relation Age of Onset  .  Gallbladder disease Mother   . Gallstones Father   . Hypertension Father   . Asperger's syndrome Brother   . Healthy Son   . Cancer - Ovarian Paternal Grandmother    Past Surgical History:  Procedure Laterality Date  . CESAREAN SECTION     2009  . HERNIA  REPAIR     1978   Social History   Social History Narrative  . Not on file   Immunization History  Administered Date(s) Administered  . Moderna SARS-COVID-2 Vaccination 05/23/2019, 06/23/2019     Objective: Vital Signs: BP 128/78 (BP Location: Left Wrist, Patient Position: Sitting, Cuff Size: Normal)   Pulse 81   Resp 16   Ht 5\' 11"  (1.803 m)   Wt (!) 367 lb (166.5 kg)   BMI 51.19 kg/m    Physical Exam Vitals and nursing note reviewed.  Constitutional:      Appearance: She is well-developed.  HENT:     Head: Normocephalic and atraumatic.  Eyes:     Conjunctiva/sclera: Conjunctivae normal.  Pulmonary:     Effort: Pulmonary effort is normal.  Abdominal:     Palpations: Abdomen is soft.  Musculoskeletal:     Cervical back: Normal range of motion.  Skin:    General: Skin is warm and dry.     Capillary Refill: Capillary refill takes less than 2 seconds.  Neurological:     Mental Status: She is alert and oriented to person, place, and time.  Psychiatric:        Behavior: Behavior normal.      Musculoskeletal Exam: C-spine good ROM.  Shoulder joints, elbow joints, wrist joints, MCPs, PIPs, and DIPs have good range of motion with no synovitis.  She has complete fist formation bilaterally.  Mild DIP thickening consistent with early osteoarthritis noted.  Tenderness over the right trochanteric bursa.  Knee joints have good range of motion with no warmth or effusion.  Ankle joints have good range of motion with no tenderness.  Pedal edema noted bilaterally.  CDAI Exam: CDAI Score: 0.4  Patient Global: 2 mm; Provider Global: 2 mm Swollen: 0 ; Tender: 0  Joint Exam 11/19/2019   No joint exam has been documented for this visit   There is currently no information documented on the homunculus. Go to the Rheumatology activity and complete the homunculus joint exam.  Investigation: No additional findings.  Imaging: No results found.  Recent Labs: Lab Results  Component  Value Date   WBC 5.8 05/13/2019   HGB 12.3 05/13/2019   PLT 362 05/13/2019   NA 138 05/13/2019   K 4.6 05/13/2019   CL 106 05/13/2019   CO2 24 05/13/2019   GLUCOSE 103 (H) 05/13/2019   BUN 13 05/13/2019   CREATININE 0.76 05/13/2019   BILITOT 0.5 05/13/2019   ALKPHOS 70 10/06/2013   AST 16 05/13/2019   ALT 19 05/13/2019   PROT 6.8 05/13/2019   ALBUMIN 3.9 10/06/2013   CALCIUM 9.0 05/13/2019   GFRAA 111 05/13/2019    Speciality Comments: PLQ eye exam: 10/14/2019 WNL. [facility]. Follow up in 6 months.  Procedures:  No procedures performed Allergies: Other   Assessment / Plan:     Visit Diagnoses: Rheumatoid arthritis involving multiple sites with positive rheumatoid factor (HCC) - Positive RF, positive ANA: She has no joint tenderness or synovitis on exam.  She has not had any recent rheumatoid arthritis flares.  She is clinically doing well on Plaquenil 200 mg 1 tablet by mouth twice daily.  She is tolerating Plaquenil without any side effects.  She has not had any recent infections.  She experiences intermittent discomfort in both hands, both knees, both ankles, and both feet.  She has not noticed any joint swelling.  She does not want to make any medication changes at this time.  She will continue taking Plaquenil 200 mg 1 tablet by mouth twice daily.  She was advised to notify us if she develops increased joint pain or joint swelling.  She will follow-up in the office in 5 months.- Plan: hydroxychloroquine (PLAQUENIL) 200 MG tablet  High risk medication use - PLQ 200 mg by mouth twice daily. PLQ eye exam: 10/14/2019.  CBC and CMP were drawn on 05/13/2019.  She is due to update lab work today.  Orders for CBC and CMP were released.- Plan: CBC with Differential/Platelet, COMPLETE METABOLIC PANEL WITH GFR She has received the COVID-19 vaccination and is planning on receiving the third dose once she is eligible.  She was advised to notify us or her PCP if she develops a COVID-19  infection in order to receive the antibody infusion.  She was encouraged to continue to wear a mask and social distance.  She voiced understanding.  Primary osteoarthritis of right knee - X-ray showed mild osteoarthritis and mild chondromalacia patella.  She has good range of motion of the right knee joint on exam.  No warmth or effusion was noted.  Trochanteric bursitis, right hip: She has been experiencing increased discomfort due to right trochanter bursitis over the past several days.  She states that her discomfort was exacerbated by walking recently.  She has tenderness palpation over the right trochanteric bursa on exam.  She was given a handout of exercises to perform.  She was advised to notify us if her discomfort persists or worsens.  Anticardiolipin antibody positive - Negative in 2018.   Elevated beta-2  - Negative in 2018   Primary osteoarthritis of both hands: She has mild DIP thickening consistent with osteoarthritis of both hands.  No tenderness or inflammation was noted.  She experiences intermittent discomfort and stiffness in both hands.  Joint protection and muscle strengthening were discussed.  Lateral epicondylitis, right elbow: Resolved  Carpal tunnel syndrome of left wrist - Resolved   Cervical radiculitis: She is not experiencing any discomfort in her neck at this time.  She has no symptoms of radiculopathy.  Other medical conditions are listed as follows:  History of depression   Orders: Orders Placed This Encounter  Procedures  . CBC with Differential/Platelet  . COMPLETE METABOLIC PANEL WITH GFR   Meds ordered this encounter  Medications  . hydroxychloroquine (PLAQUENIL) 200 MG tablet    Sig: Take 1 tablet (200 mg total) by mouth 2 (two) times daily.    Dispense:  180 tablet    Refill:  0      Follow-Up Instructions: Return in about 5 months (around 04/20/2020) for Rheumatoid arthritis.   Gearldine Bienenstock, PA-C  Note - This record has been created  using Dragon software.  Chart creation errors have been sought, but may not always  have been located. Such creation errors do not reflect on  the standard of medical care.

## 2019-11-11 ENCOUNTER — Ambulatory Visit: Payer: Managed Care, Other (non HMO) | Admitting: Physician Assistant

## 2019-11-19 ENCOUNTER — Encounter: Payer: Self-pay | Admitting: Physician Assistant

## 2019-11-19 ENCOUNTER — Other Ambulatory Visit: Payer: Self-pay

## 2019-11-19 ENCOUNTER — Ambulatory Visit: Payer: Managed Care, Other (non HMO) | Admitting: Physician Assistant

## 2019-11-19 VITALS — BP 128/78 | HR 81 | Resp 16 | Ht 71.0 in | Wt 367.0 lb

## 2019-11-19 DIAGNOSIS — M79641 Pain in right hand: Secondary | ICD-10-CM | POA: Diagnosis not present

## 2019-11-19 DIAGNOSIS — M1711 Unilateral primary osteoarthritis, right knee: Secondary | ICD-10-CM

## 2019-11-19 DIAGNOSIS — M0579 Rheumatoid arthritis with rheumatoid factor of multiple sites without organ or systems involvement: Secondary | ICD-10-CM | POA: Diagnosis not present

## 2019-11-19 DIAGNOSIS — M19041 Primary osteoarthritis, right hand: Secondary | ICD-10-CM

## 2019-11-19 DIAGNOSIS — G5602 Carpal tunnel syndrome, left upper limb: Secondary | ICD-10-CM

## 2019-11-19 DIAGNOSIS — Z79899 Other long term (current) drug therapy: Secondary | ICD-10-CM | POA: Diagnosis not present

## 2019-11-19 DIAGNOSIS — R760 Raised antibody titer: Secondary | ICD-10-CM

## 2019-11-19 DIAGNOSIS — M7711 Lateral epicondylitis, right elbow: Secondary | ICD-10-CM

## 2019-11-19 DIAGNOSIS — Z8659 Personal history of other mental and behavioral disorders: Secondary | ICD-10-CM

## 2019-11-19 DIAGNOSIS — M7061 Trochanteric bursitis, right hip: Secondary | ICD-10-CM

## 2019-11-19 DIAGNOSIS — M5412 Radiculopathy, cervical region: Secondary | ICD-10-CM

## 2019-11-19 DIAGNOSIS — M79642 Pain in left hand: Secondary | ICD-10-CM

## 2019-11-19 DIAGNOSIS — M19042 Primary osteoarthritis, left hand: Secondary | ICD-10-CM

## 2019-11-19 DIAGNOSIS — R7989 Other specified abnormal findings of blood chemistry: Secondary | ICD-10-CM

## 2019-11-19 LAB — COMPLETE METABOLIC PANEL WITH GFR
AG Ratio: 1.4 (calc) (ref 1.0–2.5)
ALT: 22 U/L (ref 6–29)
AST: 18 U/L (ref 10–30)
Albumin: 4 g/dL (ref 3.6–5.1)
Alkaline phosphatase (APISO): 63 U/L (ref 31–125)
BUN: 12 mg/dL (ref 7–25)
CO2: 25 mmol/L (ref 20–32)
Calcium: 9.2 mg/dL (ref 8.6–10.2)
Chloride: 106 mmol/L (ref 98–110)
Creat: 0.79 mg/dL (ref 0.50–1.10)
GFR, Est African American: 106 mL/min/{1.73_m2} (ref >=60)
GFR, Est Non African American: 92 mL/min/{1.73_m2} (ref >=60)
Globulin: 2.8 g/dL (calc) (ref 1.9–3.7)
Glucose, Bld: 145 mg/dL — ABNORMAL HIGH (ref 65–99)
Potassium: 4.2 mmol/L (ref 3.5–5.3)
Sodium: 138 mmol/L (ref 135–146)
Total Bilirubin: 0.5 mg/dL (ref 0.2–1.2)
Total Protein: 6.8 g/dL (ref 6.1–8.1)

## 2019-11-19 LAB — CBC WITH DIFFERENTIAL/PLATELET
Absolute Monocytes: 418 cells/uL (ref 200–950)
Basophils Absolute: 33 cells/uL (ref 0–200)
Basophils Relative: 0.4 %
Eosinophils Absolute: 156 cells/uL (ref 15–500)
Eosinophils Relative: 1.9 %
HCT: 36.7 % (ref 35.0–45.0)
Hemoglobin: 12 g/dL (ref 11.7–15.5)
Lymphs Abs: 1788 cells/uL (ref 850–3900)
MCH: 25.6 pg — ABNORMAL LOW (ref 27.0–33.0)
MCHC: 32.7 g/dL (ref 32.0–36.0)
MCV: 78.3 fL — ABNORMAL LOW (ref 80.0–100.0)
MPV: 9.8 fL (ref 7.5–12.5)
Monocytes Relative: 5.1 %
Neutro Abs: 5806 cells/uL (ref 1500–7800)
Neutrophils Relative %: 70.8 %
Platelets: 336 10*3/uL (ref 140–400)
RBC: 4.69 10*6/uL (ref 3.80–5.10)
RDW: 14.6 % (ref 11.0–15.0)
Total Lymphocyte: 21.8 %
WBC: 8.2 10*3/uL (ref 3.8–10.8)

## 2019-11-19 MED ORDER — HYDROXYCHLOROQUINE SULFATE 200 MG PO TABS: 200.0000 mg | ORAL_TABLET | Freq: Two times a day (BID) | ORAL | 0 refills | Status: DC

## 2019-11-19 NOTE — Patient Instructions (Addendum)
COVID-19 vaccine recommendations:   COVID-19 vaccine is recommended for everyone (unless you are allergic to a vaccine component), even if you are on a medication that suppresses your immune system.   If you are on Methotrexate, Cellcept (mycophenolate), Rinvoq, Harriette Ohara, and Olumiant- hold the medication for 1 week after each vaccine. Hold Methotrexate for 2 weeks after the single dose COVID-19 vaccine.   If you are on Orencia subcutaneous injection - hold medication one week prior to and one week after the first COVID-19 vaccine dose (only).   If you are on Orencia IV infusions- time vaccination administration so that the first COVID-19 vaccination will occur four weeks after the infusion and postpone the subsequent infusion by one week.   If you are on Cyclophosphamide or Rituxan infusions please contact your doctor prior to receiving the COVID-19 vaccine.   Do not take Tylenol or any anti-inflammatory medications (NSAIDs) 24 hours prior to the COVID-19 vaccination.   There is no direct evidence about the efficacy of the COVID-19 vaccine in individuals who are on medications that suppress the immune system.   Even if you are fully vaccinated, and you are on any medications that suppress your immune system, please continue to wear a mask, maintain at least six feet social distance and practice hand hygiene.   If you develop a COVID-19 infection, please contact your PCP or our office to determine if you need antibody infusion.  The booster vaccine is now available for immunocompromised patients. It is advised that if you had Pfizer vaccine you should get [employer] booster.  If you had a Moderna vaccine then you should get a Moderna booster. Johnson and [employer]ural [employer] does not have a booster vaccine at this time.  Please see the following web sites for updated information.    https://www.rheumatology.org/Portals/0/Files/COVID-19-Vaccination-Patient-Resources.pdf  https://www.[facility]heumatology.org/About-Us/Newsroom/Press-Releases/ID/1159    Iliotibial Band Syndrome Rehab Ask your health care provider which exercises are safe for you. Do exercises exactly as told by your health care provider and adjust them as directed. It is normal to feel mild stretching, pulling, tightness, or discomfort as you do these exercises. Stop right away if you feel sudden pain or your pain gets significantly worse. Do not begin these exercises until told by your health care provider. Stretching and range-of-motion exercises These exercises warm up your muscles and joints and improve the movement and flexibility of your hip and pelvis. Quadriceps stretch, prone  1. Lie on your abdomen on a firm surface, such as a bed or padded floor (prone position). 2. Bend your left / right knee and reach back to hold your ankle or pant leg. If you cannot reach your ankle or pant leg, loop a belt around your foot and grab the belt instead. 3. Gently pull your heel toward your buttocks. Your knee should not slide out to the side. You should feel a stretch in the front of your thigh and knee (quadriceps). 4. Hold this position for __________ seconds. Repeat __________ times. Complete this exercise __________ times a day. Iliotibial band stretch An iliotibial band is a strong band of muscle tissue that runs from the outer side of your hip to the outer side of your thigh and knee. 1. Lie on your side with your left / right leg in the top position. 2. Bend both of your knees and grab your left / right ankle. Stretch out your bottom arm to help you balance. 3. Slowly bring your top knee back so your thigh goes behind your trunk. 4. Slowly lower  your top leg toward the floor until you feel a gentle stretch on the outside of your left / right hip and thigh. If you do not feel a stretch and your knee will not fall  farther, place the heel of your other foot on top of your knee and pull your knee down toward the floor with your foot. 5. Hold this position for __________ seconds. Repeat __________ times. Complete this exercise __________ times a day. Strengthening exercises These exercises build strength and endurance in your hip and pelvis. Endurance is the ability to use your muscles for a long time, even after they get tired. Straight leg raises, side-lying This exercise strengthens the muscles that rotate the leg at the hip and move it away from your body (hip abductors). 1. Lie on your side with your left / right leg in the top position. Lie so your head, shoulder, hip, and knee line up. You may bend your bottom knee to help you balance. 2. Roll your hips slightly forward so your hips are stacked directly over each other and your left / right knee is facing forward. 3. Tense the muscles in your outer thigh and lift your top leg 4-6 inches (10-15 cm). 4. Hold this position for __________ seconds. 5. Slowly return to the starting position. Let your muscles relax completely before doing another repetition. Repeat __________ times. Complete this exercise __________ times a day. Leg raises, prone This exercise strengthens the muscles that move the hips (hip extensors). 1. Lie on your abdomen on your bed or a firm surface. You can put a pillow under your hips if that is more comfortable for your lower back. 2. Bend your left / right knee so your foot is straight up in the air. 3. Squeeze your buttocks muscles and lift your left / right thigh off the bed. Do not let your back arch. 4. Tense your thigh muscle as hard as you can without increasing any knee pain. 5. Hold this position for __________ seconds. 6. Slowly lower your leg to the starting position and allow it to relax completely. Repeat __________ times. Complete this exercise __________ times a day. Hip hike 1. Stand sideways on a bottom step. Stand  on your left / right leg with your other foot unsupported next to the step. You can hold on to the railing or wall for balance if needed. 2. Keep your knees straight and your torso square. Then lift your left / right hip up toward the ceiling. 3. Slowly let your left / right hip lower toward the floor, past the starting position. Your foot should get closer to the floor. Do not lean or bend your knees. Repeat __________ times. Complete this exercise __________ times a day. This information is not intended to replace advice given to you by your health care provider. Make sure you discuss any questions you have with your health care provider. Document Revised: 06/19/2018 Document Reviewed: 12/18/2017 Elsevier Patient Education  2020 [employer]  Hip Bursitis Rehab Ask your health care provider which exercises are safe for you. Do exercises exactly as told by your health care provider and adjust them as directed. It is normal to feel mild stretching, pulling, tightness, or discomfort as you do these exercises. Stop right away if you feel sudden pain or your pain gets worse. Do not begin these exercises until told by your health care provider. Stretching exercise This exercise warms up your muscles and joints and improves the movement and flexibility of your  hip. This exercise also helps to relieve pain and stiffness. Iliotibial band stretch An iliotibial band is a strong band of muscle tissue that runs from the outer side of your hip to the outer side of your thigh and knee. 1. Lie on your side with your left / right leg in the top position. 2. Bend your left / right knee and grab your ankle. Stretch out your bottom arm to help you balance. 3. Slowly bring your knee back so your thigh is behind your body. 4. Slowly lower your knee toward the floor until you feel a gentle stretch on the outside of your left / right thigh. If you do not feel a stretch and your knee will not fall farther, place the  heel of your other foot on top of your knee and pull your knee down toward the floor with your foot. 5. Hold this position for __________ seconds. 6. Slowly return to the starting position. Repeat __________ times. Complete this exercise __________ times a day. Strengthening exercises These exercises build strength and endurance in your hip and pelvis. Endurance is the ability to use your muscles for a long time, even after they get tired. Bridge This exercise strengthens the muscles that move your thigh backward (hip extensors). 1. Lie on your back on a firm surface with your knees bent and your feet flat on the floor. 2. Tighten your buttocks muscles and lift your buttocks off the floor until your trunk is level with your thighs. ? Do not arch your back. ? You should feel the muscles working in your buttocks and the back of your thighs. If you do not feel these muscles, slide your feet 1-2 inches (2.5-5 cm) farther away from your buttocks. ? If this exercise is too easy, try doing it with your arms crossed over your chest. 3. Hold this position for __________ seconds. 4. Slowly lower your hips to the starting position. 5. Let your muscles relax completely after each repetition. Repeat __________ times. Complete this exercise __________ times a day. Squats This exercise strengthens the muscles in front of your thigh and knee (quadriceps). 1. Stand in front of a table, with your feet and knees pointing straight ahead. You may rest your hands on the table for balance but not for support. 2. Slowly bend your knees and lower your hips like you are going to sit in a chair. ? Keep your weight over your heels, not over your toes. ? Keep your lower legs upright so they are parallel with the table legs. ? Do not let your hips go lower than your knees. ? Do not bend lower than told by your health care provider. ? If your hip pain increases, do not bend as low. 3. Hold the squat position for  __________ seconds. 4. Slowly push with your legs to return to standing. Do not use your hands to pull yourself to standing. Repeat __________ times. Complete this exercise __________ times a day. Hip hike 1. Stand sideways on a bottom step. Stand on your left / right leg with your other foot unsupported next to the step. You can hold on to the railing or wall for balance if needed. 2. Keep your knees straight and your torso square. Then lift your left / right hip up toward the ceiling. 3. Hold this position for __________ seconds. 4. Slowly let your left / right hip lower toward the floor, past the starting position. Your foot should get closer to the floor. Do not  lean or bend your knees. Repeat __________ times. Complete this exercise __________ times a day. Single leg stand 1. Without shoes, stand near a railing or in a doorway. You may hold on to the railing or door frame as needed for balance. 2. Squeeze your left / right buttock muscles, then lift up your other foot. ? Do not let your left / right hip push out to the side. ? It is helpful to stand in front of a mirror for this exercise so you can watch your hip. 3. Hold this position for __________ seconds. Repeat __________ times. Complete this exercise __________ times a day. This information is not intended to replace advice given to you by your health care provider. Make sure you discuss any questions you have with your health care provider. Document Revised: 06/23/2018 Document Reviewed: 06/23/2018 Elsevier Patient Education  2020 [employer].

## 2019-11-20 NOTE — Progress Notes (Signed)
Glucose is 145.  Rest of CMP WNL.  MCV and MCH are low but stable.  Rest of CBC WNL.

## 2020-01-27 ENCOUNTER — Telehealth: Payer: Self-pay | Admitting: *Deleted

## 2020-01-27 NOTE — Telephone Encounter (Signed)
Renee Bienenstock, PA-C  Renee Combs, LPN Please call the patient to notify her of this alert. Please ask if she was recently started on celexa or if the dose was increased recently. She may want to discuss alternatives with her PCP due to the risk of QT prolongation while on PLQ   Attempted to contact the patient and left message for patient to call the office.

## 2020-04-01 ENCOUNTER — Other Ambulatory Visit: Payer: Self-pay | Admitting: Internal Medicine

## 2020-04-08 NOTE — Progress Notes (Unsigned)
Office Visit Note  Patient: Renee Howard             Date of Birth: Aug 25, 1975           MRN: [id number]             PCP: Jarrett Soho, PA-C Referring: Jarrett Soho, PA-C Visit Date: 04/21/2020 Occupation: @GUAROCC @  Subjective:  Left ankle joint pain   History of Present Illness: Renee Howard is a 45 y.o. female with history of seropositive rheumatoid arthritis and osteoarthritis.  She is taking plaquenil 200 mg 1 tablet by mouth twice daily. She has not missed any doses of PLQ recently.  Patient reports that in December 2021 she was on vacation and started to have pain in her left ankle joint. She did not have any injury or change in activity prior to the onset of symptoms. She was having difficulty standing or ambulating for prolonged distances due to the severity of pain. She was experiencing instability in the left ankle and she states that it felt as if her leg was not attached correctly to her ankle.  She also noticed swelling in her left ankle.  She took ibuprofen during the flare which provided some pain relief.  Patient reports that she was evaluated and had an ultrasound which was negative for DVT.  She was prescribed Lasix per month which helped with the edema. She has not had any recurrence of the left ankle joint pain.  She denies any nocturnal pain or difficulty bearing full weight.  She denies any other joint pain or joint swelling.    Activities of Daily Living:  Patient reports morning stiffness for 10  minutes.   Patient Denies nocturnal pain.  Difficulty dressing/grooming: Reports Difficulty climbing stairs: Reports Difficulty getting out of chair: Reports Difficulty using hands for taps, buttons, cutlery, and/or writing: Denies  Review of Systems  Constitutional: Negative for fatigue.  HENT: Negative for mouth sores, mouth dryness and nose dryness.   Eyes: Positive for dryness. Negative for pain and itching.  Respiratory: Positive for shortness  of breath. Negative for difficulty breathing.   Cardiovascular: Positive for swelling in legs/feet. Negative for chest pain and palpitations.  Gastrointestinal: Negative for blood in stool, constipation and diarrhea.  Endocrine: Negative for increased urination.  Genitourinary: Negative for difficulty urinating.  Musculoskeletal: Positive for joint swelling and morning stiffness. Negative for arthralgias, joint pain, myalgias, muscle weakness, muscle tenderness and myalgias.  Skin: Positive for rash. Negative for color change.  Allergic/Immunologic: Negative for susceptible to infections.  Neurological: Positive for headaches and weakness. Negative for dizziness, numbness and memory loss.  Hematological: Negative for bruising/bleeding tendency.  Psychiatric/Behavioral: Negative for confusion.    PMFS History:  Patient Active Problem List   Diagnosis Date Noted  . Primary osteoarthritis of both hands 12/26/2016  . High risk medication use 06/28/2016  . Anticardiolipin antibody positive 06/18/2016  . Elevated beta-2  06/18/2016  . Numbness 09/03/2014  . Urinary frequency 09/03/2014  . Gait disturbance 09/03/2014  . Arm pain, left 09/03/2014  . Carpal tunnel syndrome 09/03/2014  . Cervical radiculitis 09/03/2014  . Rheumatoid arthritis involving multiple sites with positive rheumatoid factor (HCC) 09/03/2014    Past Medical History:  Diagnosis Date  . Anticardiolipin antibody positive 06/18/2016  . Anxiety   . Arm pain, left 09/03/2014  . Arthritis   . Carpal tunnel syndrome 09/03/2014  . Cervical radiculitis 09/03/2014  . Depression   . DVT (deep venous thrombosis) (HCC)   .  Dyspnea on exertion   . Elevated beta-2  06/18/2016  . Gait disturbance 09/03/2014  . Low back pain   . No pertinent past medical history   . Numbness 09/03/2014  . PONV (postoperative nausea and vomiting)   . Primary osteoarthritis of both hands 12/26/2016  . Rheumatoid arthritis involving multiple sites with  positive rheumatoid factor (HCC) 09/03/2014  . Urinary frequency 09/03/2014    Family History  Problem Relation Age of Onset  . Gallbladder disease Mother   . Gallstones Father   . Hypertension Father   . Asperger's syndrome Brother   . Healthy Son   . Cancer - Ovarian Paternal Grandmother    Past Surgical History:  Procedure Laterality Date  . CESAREAN SECTION     2009  . HERNIA REPAIR     1978   Social History   Social History Narrative  . Not on file   Immunization History  Administered Date(s) Administered  . Moderna Sars-Covid-2 Vaccination 05/23/2019, 06/23/2019  . PFIZER(Purple Top)SARS-COV-2 Vaccination 01/04/2020     Objective: Vital Signs: BP 135/80 (BP Location: Left Wrist, Patient Position: Sitting, Cuff Size: Normal)   Pulse 88   Resp 17   Ht 5\' 11"  (1.803 m)   Wt (!) 370 lb 3.2 oz (167.9 kg)   BMI 51.63 kg/m    Physical Exam Vitals and nursing note reviewed.  Constitutional:      Appearance: She is well-developed and well-nourished.  HENT:     Head: Normocephalic and atraumatic.  Eyes:     Extraocular Movements: EOM normal.     Conjunctiva/sclera: Conjunctivae normal.  Cardiovascular:     Pulses: Intact distal pulses.  Pulmonary:     Effort: Pulmonary effort is normal.  Abdominal:     Palpations: Abdomen is soft.  Musculoskeletal:     Cervical back: Normal range of motion.  Skin:    General: Skin is warm and dry.     Capillary Refill: Capillary refill takes less than 2 seconds.  Neurological:     Mental Status: She is alert and oriented to person, place, and time.  Psychiatric:        Mood and Affect: Mood and affect normal.        Behavior: Behavior normal.      Musculoskeletal Exam: C-spine has good range of motion with no discomfort.  Shoulder joints, elbow joints, wrist joints, MCPs, PIPs, DIPs have good range of motion with no synovitis.  She is able to make a complete fist bilaterally.  Knee joints have good range of motion with no  discomfort.  Ankle joints have good range of motion with no tenderness.  Pedal edema noted bilaterally.  Mild tenderness along the plantar fascia of the left foot.  No tenderness along the Achilles tendon.  No tenderness of MTP joints.  CDAI Exam: CDAI Score: 0.2  Patient Global: 1 mm; Provider Global: 1 mm Swollen: 0 ; Tender: 0  Joint Exam 04/21/2020   No joint exam has been documented for this visit   There is currently no information documented on the homunculus. Go to the Rheumatology activity and complete the homunculus joint exam.  Investigation: No additional findings.  Imaging: No results found.  Recent Labs: Lab Results  Component Value Date   WBC 8.2 11/19/2019   HGB 12.0 11/19/2019   PLT 336 11/19/2019   NA 138 11/19/2019   K 4.2 11/19/2019   CL 106 11/19/2019   CO2 25 11/19/2019   GLUCOSE 145 (H) 11/19/2019  BUN 12 11/19/2019   CREATININE 0.79 11/19/2019   BILITOT 0.5 11/19/2019   ALKPHOS 70 10/06/2013   AST 18 11/19/2019   ALT 22 11/19/2019   PROT 6.8 11/19/2019   ALBUMIN 3.9 10/06/2013   CALCIUM 9.2 11/19/2019   GFRAA 106 11/19/2019    Speciality Comments: PLQ eye exam: 10/14/2019 WNL. [facility]. Follow up in 6 months.  Procedures:  No procedures performed Allergies: Patient has no active allergies.   Assessment / Plan:     Visit Diagnoses: Rheumatoid arthritis involving multiple sites with positive rheumatoid factor (HCC) - Positive RF, positive ANA: She has no joint tenderness or synovitis on exam.  She had a flare in the left ankle joint in December 2021 while on vacation.  She did not have any injury or change in activity prior to the onset of symptoms.  During this flare she had pain, swelling, and instability in the left ankle.  She took ibuprofen which alleviated some of her discomfort.  She has not had any recurrence of left ankle joint pain and has no tenderness or inflammation on examination today.  She has not been experiencing any  other joint pain or swelling recently.  She has not been experiencing any nocturnal pain.  Overall she has clinically been doing well taking Plaquenil 200 mg 1 tablet by mouth twice daily.  She continues to tolerate Plaquenil without any side effects and has not missed any doses recently.  She was advised to notify us if her left ankle joint pain returns and we will obtain updated x-rays and discuss further treatment options at that time.  She will continue taking Plaquenil as prescribed. She will follow up in the office in 5 months.  High risk medication use - Plaquenil 200 mg 1 tablet by mouth twice daily. PLQ eye exam: 10/14/2019.  CBC and CMP drawn on 11/19/19.  She is due to update CBC and CMP today.  Orders were released. PLQ eye exam: 10/14/2019 WNL. [facility]. Follow up in 6 months.  - Plan: CBC with Differential/Platelet, COMPLETE METABOLIC PANEL WITH GFR She has not had any recent infections. She has received 2 moderna vaccines and 1 pfizer vaccine dose.    Primary osteoarthritis of right knee - X-ray showed mild osteoarthritis and mild chondromalacia patella.  She has good ROM of the right knee joint with no discomfort at this time.  No warmth or effusion was noted.  Trochanteric bursitis, right hip: She has occasional discomfort due to trochanter bursitis of the left hip.  Anticardiolipin antibody positive - Negative in 2018   Elevated beta-2  - Negative in 2018   Primary osteoarthritis of both hands: She is not experiencing any joint tenderness or inflammation at this time.  She has complete fist formation bilaterally.  She experiences some increased stiffness in both hands around the time of her menstrual cycle.   Lateral epicondylitis, right elbow - Resolved   Carpal tunnel syndrome of left wrist - Resolved   Cervical radiculitis: She is not experiencing any discomfort in her neck at this time.  She has good ROM with no pain.   History of depression  Orders: Orders Placed  This Encounter  Procedures  . CBC with Differential/Platelet  . COMPLETE METABOLIC PANEL WITH GFR   No orders of the defined types were placed in this encounter.     Follow-Up Instructions: Return in about 5 months (around 09/18/2020) for Rheumatoid arthritis, Osteoarthritis.   Gearldine Bienenstock, PA-C  Note -  This record has been created using [employer].  Chart creation errors have been sought, but may not always  have been located. Such creation errors do not reflect on  the standard of medical care.

## 2020-04-21 ENCOUNTER — Encounter: Payer: Self-pay | Admitting: Physician Assistant

## 2020-04-21 ENCOUNTER — Ambulatory Visit: Payer: Managed Care, Other (non HMO) | Admitting: Physician Assistant

## 2020-04-21 ENCOUNTER — Other Ambulatory Visit: Payer: Self-pay

## 2020-04-21 VITALS — BP 135/80 | HR 88 | Resp 17 | Ht 71.0 in | Wt 370.2 lb

## 2020-04-21 DIAGNOSIS — M0579 Rheumatoid arthritis with rheumatoid factor of multiple sites without organ or systems involvement: Secondary | ICD-10-CM | POA: Diagnosis not present

## 2020-04-21 DIAGNOSIS — R7989 Other specified abnormal findings of blood chemistry: Secondary | ICD-10-CM

## 2020-04-21 DIAGNOSIS — Z79899 Other long term (current) drug therapy: Secondary | ICD-10-CM | POA: Diagnosis not present

## 2020-04-21 DIAGNOSIS — M19041 Primary osteoarthritis, right hand: Secondary | ICD-10-CM

## 2020-04-21 DIAGNOSIS — M7711 Lateral epicondylitis, right elbow: Secondary | ICD-10-CM

## 2020-04-21 DIAGNOSIS — M1711 Unilateral primary osteoarthritis, right knee: Secondary | ICD-10-CM

## 2020-04-21 DIAGNOSIS — M7061 Trochanteric bursitis, right hip: Secondary | ICD-10-CM | POA: Diagnosis not present

## 2020-04-21 DIAGNOSIS — M5412 Radiculopathy, cervical region: Secondary | ICD-10-CM

## 2020-04-21 DIAGNOSIS — R760 Raised antibody titer: Secondary | ICD-10-CM

## 2020-04-21 DIAGNOSIS — G5602 Carpal tunnel syndrome, left upper limb: Secondary | ICD-10-CM

## 2020-04-21 DIAGNOSIS — M19042 Primary osteoarthritis, left hand: Secondary | ICD-10-CM

## 2020-04-21 DIAGNOSIS — Z8659 Personal history of other mental and behavioral disorders: Secondary | ICD-10-CM

## 2020-04-22 LAB — COMPLETE METABOLIC PANEL WITH GFR
AG Ratio: 1.4 (calc) (ref 1.0–2.5)
ALT: 19 U/L (ref 6–29)
AST: 16 U/L (ref 10–30)
Albumin: 4.1 g/dL (ref 3.6–5.1)
Alkaline phosphatase (APISO): 74 U/L (ref 31–125)
BUN: 12 mg/dL (ref 7–25)
CO2: 27 mmol/L (ref 20–32)
Calcium: 9.3 mg/dL (ref 8.6–10.2)
Chloride: 105 mmol/L (ref 98–110)
Creat: 0.87 mg/dL (ref 0.50–1.10)
GFR, Est African American: 94 mL/min/{1.73_m2} (ref >=60)
GFR, Est Non African American: 81 mL/min/{1.73_m2} (ref >=60)
Globulin: 3 g/dL (calc) (ref 1.9–3.7)
Glucose, Bld: 119 mg/dL — ABNORMAL HIGH (ref 65–99)
Potassium: 4.3 mmol/L (ref 3.5–5.3)
Sodium: 139 mmol/L (ref 135–146)
Total Bilirubin: 0.5 mg/dL (ref 0.2–1.2)
Total Protein: 7.1 g/dL (ref 6.1–8.1)

## 2020-04-22 LAB — CBC WITH DIFFERENTIAL/PLATELET
Absolute Monocytes: 431 cells/uL (ref 200–950)
Basophils Absolute: 51 cells/uL (ref 0–200)
Basophils Relative: 0.7 %
Eosinophils Absolute: 204 cells/uL (ref 15–500)
Eosinophils Relative: 2.8 %
HCT: 38.3 % (ref 35.0–45.0)
Hemoglobin: 12.4 g/dL (ref 11.7–15.5)
Lymphs Abs: 1613 cells/uL (ref 850–3900)
MCH: 24.8 pg — ABNORMAL LOW (ref 27.0–33.0)
MCHC: 32.4 g/dL (ref 32.0–36.0)
MCV: 76.6 fL — ABNORMAL LOW (ref 80.0–100.0)
MPV: 9.2 fL (ref 7.5–12.5)
Monocytes Relative: 5.9 %
Neutro Abs: 5001 cells/uL (ref 1500–7800)
Neutrophils Relative %: 68.5 %
Platelets: 385 10*3/uL (ref 140–400)
RBC: 5 10*6/uL (ref 3.80–5.10)
RDW: 14.8 % (ref 11.0–15.0)
Total Lymphocyte: 22.1 %
WBC: 7.3 10*3/uL (ref 3.8–10.8)

## 2020-04-22 NOTE — Progress Notes (Signed)
Glucose is 119. Rest of CMP WNL.  MCV and MCH are low.  Hemoglobin and hematocrit WNL.  Rest of CBC WNL.

## 2020-05-17 ENCOUNTER — Other Ambulatory Visit: Payer: Self-pay | Admitting: Obstetrics & Gynecology

## 2020-05-17 DIAGNOSIS — R928 Other abnormal and inconclusive findings on diagnostic imaging of breast: Secondary | ICD-10-CM

## 2020-06-01 ENCOUNTER — Other Ambulatory Visit: Payer: Managed Care, Other (non HMO)

## 2020-06-22 ENCOUNTER — Ambulatory Visit
Admission: RE | Admit: 2020-06-22 | Discharge: 2020-06-22 | Disposition: A | Discharge status: Home / Self Care | Payer: Managed Care, Other (non HMO) | Source: Ambulatory Visit | Attending: Obstetrics & Gynecology | Admitting: Obstetrics & Gynecology

## 2020-06-22 ENCOUNTER — Other Ambulatory Visit: Payer: Self-pay

## 2020-06-22 DIAGNOSIS — R928 Other abnormal and inconclusive findings on diagnostic imaging of breast: Secondary | ICD-10-CM

## 2020-09-07 NOTE — Progress Notes (Signed)
Office Visit Note  Patient: Renee Howard             Date of Birth: Jun 28, 1975           MRN: [id number]             PCP: Charisse March, NP-C Referring: Jarrett Soho, PA-C Visit Date: 09/20/2020 Occupation: @GUAROCC @  Subjective:  Follow-up (PLQ eye exam scheduled 10/19/2020. )   History of Present Illness: Renee Howard is a 45 y.o. female with a history of rheumatoid arthritis and osteoarthritis.  She states she has been doing quite well on Plaquenil.  She has not noticed any joint swelling.  Although for the last few months she has been experiencing increased pain and discomfort in the left knee joint.  Right knee joint discomfort resolved.  She has not noticed any left knee joint swelling.  She also has been experiencing increased shortness of breath.  She states she is not sure if it is related to her weight or some other etiology.  Activities of Daily Living:  Patient reports morning stiffness for 10-15 minutes.   Patient Reports nocturnal pain.  Difficulty dressing/grooming: Denies Difficulty climbing stairs: Reports Difficulty getting out of chair: Reports Difficulty using hands for taps, buttons, cutlery, and/or writing: Denies  Review of Systems  Constitutional:  Positive for fatigue.  HENT:  Negative for mouth sores, mouth dryness and nose dryness.   Eyes:  Positive for pain, visual disturbance and dryness. Negative for itching.  Respiratory:  Positive for shortness of breath and difficulty breathing. Negative for cough and hemoptysis.   Cardiovascular:  Positive for swelling in legs/feet. Negative for chest pain and palpitations.  Gastrointestinal:  Negative for abdominal pain, blood in stool, constipation and diarrhea.  Endocrine: Negative for increased urination.  Genitourinary:  Negative for painful urination.  Musculoskeletal:  Positive for joint pain, gait problem, joint pain, joint swelling and morning stiffness. Negative for myalgias, muscle  weakness, muscle tenderness and myalgias.  Skin:  Negative for color change, rash, redness and sensitivity to sunlight.  Allergic/Immunologic: Negative for susceptible to infections.  Neurological:  Positive for numbness, headaches and weakness. Negative for dizziness and memory loss.  Hematological:  Negative for swollen glands.  Psychiatric/Behavioral:  Negative for confusion and sleep disturbance.        Uses CPAP   PMFS History:  Patient Active Problem List   Diagnosis Date Noted   Primary osteoarthritis of both hands 12/26/2016   High risk medication use 06/28/2016   Anticardiolipin antibody positive 06/18/2016   Elevated beta-2  06/18/2016   Numbness 09/03/2014   Urinary frequency 09/03/2014   Gait disturbance 09/03/2014   Arm pain, left 09/03/2014   Carpal tunnel syndrome 09/03/2014   Cervical radiculitis 09/03/2014   Rheumatoid arthritis involving multiple sites with positive rheumatoid factor (HCC) 09/03/2014    Past Medical History:  Diagnosis Date   Anticardiolipin antibody positive 06/18/2016   Anxiety    Arm pain, left 09/03/2014   Arthritis    Carpal tunnel syndrome 09/03/2014   Cervical radiculitis 09/03/2014   Depression    DVT (deep venous thrombosis) (HCC)    Dyspnea on exertion    Elevated beta-2  06/18/2016   Gait disturbance 09/03/2014   Low back pain    No pertinent past medical history    Numbness 09/03/2014   PONV (postoperative nausea and vomiting)    Primary osteoarthritis of both hands 12/26/2016   Rheumatoid arthritis involving multiple sites with positive rheumatoid factor (HCC)  09/03/2014   Urinary frequency 09/03/2014    Family History  Problem Relation Age of Onset   Gallbladder disease Mother    Gallstones Father    Hypertension Father    Asperger's syndrome Brother    Cancer - Ovarian Paternal Grandmother    Healthy Son    Past Surgical History:  Procedure Laterality Date   CESAREAN SECTION     2009   HERNIA REPAIR     1978   Social  History   Social History Narrative   Not on file   Immunization History  Administered Date(s) Administered   Moderna Sars-Covid-2 Vaccination 05/23/2019, 06/23/2019   PFIZER(Purple Top)SARS-COV-2 Vaccination 01/04/2020     Objective: Vital Signs: BP 121/80 (BP Location: Left Arm, Patient Position: Sitting, Cuff Size: Large)   Pulse 91   Ht 5' 10.5" (1.791 m)   Wt (!) 356 lb (161.5 kg)   BMI 50.36 kg/m    Physical Exam Vitals and nursing note reviewed.  Constitutional:      Appearance: She is well-developed.  HENT:     Head: Normocephalic and atraumatic.  Eyes:     Conjunctiva/sclera: Conjunctivae normal.  Cardiovascular:     Rate and Rhythm: Normal rate and regular rhythm.     Heart sounds: Normal heart sounds.  Pulmonary:     Effort: Pulmonary effort is normal.     Breath sounds: Normal breath sounds.  Abdominal:     General: Bowel sounds are normal.     Palpations: Abdomen is soft.  Musculoskeletal:     Cervical back: Normal range of motion.  Lymphadenopathy:     Cervical: No cervical adenopathy.  Skin:    General: Skin is warm and dry.     Capillary Refill: Capillary refill takes less than 2 seconds.  Neurological:     Mental Status: She is alert and oriented to person, place, and time.  Psychiatric:        Behavior: Behavior normal.     Musculoskeletal Exam: C-spine was in good range of motion.  Shoulder joints, elbow joints, wrist joints, MCPs PIPs and DIPs with good range of motion with no synovitis.  Hip joints, knee joints, ankles, MTPs and PIPs with good range of motion with no synovitis.  No warmth swelling or effusion was noted over the left knee joint.  CDAI Exam: CDAI Score: 0.4  Patient Global: 2 mm; Provider Global: 2 mm Swollen: 0 ; Tender: 0  Joint Exam 09/20/2020   No joint exam has been documented for this visit   There is currently no information documented on the homunculus. Go to the Rheumatology activity and complete the homunculus  joint exam.  Investigation: No additional findings.  Imaging: No results found.  Recent Labs: Lab Results  Component Value Date   WBC 7.3 04/21/2020   HGB 12.4 04/21/2020   PLT 385 04/21/2020   NA 139 04/21/2020   K 4.3 04/21/2020   CL 105 04/21/2020   CO2 27 04/21/2020   GLUCOSE 119 (H) 04/21/2020   BUN 12 04/21/2020   CREATININE 0.87 04/21/2020   BILITOT 0.5 04/21/2020   ALKPHOS 70 10/06/2013   AST 16 04/21/2020   ALT 19 04/21/2020   PROT 7.1 04/21/2020   ALBUMIN 3.9 10/06/2013   CALCIUM 9.3 04/21/2020   GFRAA 94 04/21/2020    Speciality Comments: PLQ eye exam: 10/14/2019 WNL. [facility]. Follow up in 6 months.  Procedures:  No procedures performed Allergies: Patient has no known allergies.   Assessment /  Plan:     Visit Diagnoses: Rheumatoid arthritis involving multiple sites with positive rheumatoid factor (HCC) - Positive RF, positive ANA: Patient had no synovitis on my examination today.  She denies increased joint swelling.  She has been tolerating Plaquenil well without any side effects.  High risk medication use - Plaquenil 200 mg 1 tablet by mouth twice daily. PLQ eye exam: 10/14/2019. - Plan: CBC with Differential/Platelet, COMPLETE METABOLIC PANEL WITH GFR today.  Primary osteoarthritis of both hands-joint protection muscle strengthening was discussed.  Chronic pain of left knee -she has been having progressively increased pain in her left knee joint over the last 2 months.  No warmth or swelling was noted plan: XR KNEE 3 VIEW LEFT.  The x-ray showed mild chondromalacia patella.  Lower extremity muscle strengthening exercises were discussed.  X-ray findings were discussed with the patient.  Primary osteoarthritis of right knee - X-ray showed mild osteoarthritis and mild chondromalacia patella.   Shortness of breath -she has been experiencing increased shortness of breath.  She is not sure if it is due to her increased rate or some underlying issue.   Her chest was clear to auscultation.  Plan: DG Chest 2 View.  I will contact her once chest x-ray results available.  I also advised her to follow-up with her PCP regarding shortness of breath.  Patient has history of sleep apnea and uses CPAP.  Anticardiolipin antibody positive -she initially had positive anticardiolipin and beta-2 antibodies which became negative.  On repeat antibodies today.  Negative in 2018  - Plan: ANA, Anti-DNA antibody, double-stranded, C3 and C4, Sedimentation rate, Beta-2 glycoprotein antibodies, Cardiolipin antibodies, IgG, IgM, IgA  Elevated beta-2  - Negative in 2018   History of depression  Sleep apnea-uses CPAP  Orders: Orders Placed This Encounter  Procedures   XR KNEE 3 VIEW LEFT   DG Chest 2 View   CBC with Differential/Platelet   COMPLETE METABOLIC PANEL WITH GFR   ANA   Anti-DNA antibody, double-stranded   C3 and C4   Sedimentation rate   Beta-2 glycoprotein antibodies   Cardiolipin antibodies, IgG, IgM, IgA    No orders of the defined types were placed in this encounter.    Follow-Up Instructions: Return in about 5 months (around 02/20/2021) for Rheumatoid arthritis.   Pollyann Savoy, MD  Note - This record has been created using Animal nutritionist.  Chart creation errors have been sought, but may not always  have been located. Such creation errors do not reflect on  the standard of medical care.

## 2020-09-20 ENCOUNTER — Ambulatory Visit: Payer: Self-pay

## 2020-09-20 ENCOUNTER — Encounter: Payer: Self-pay | Admitting: Rheumatology

## 2020-09-20 ENCOUNTER — Other Ambulatory Visit: Payer: Self-pay

## 2020-09-20 ENCOUNTER — Ambulatory Visit: Payer: Managed Care, Other (non HMO) | Admitting: Rheumatology

## 2020-09-20 VITALS — BP 121/80 | HR 91 | Ht 70.5 in | Wt 356.0 lb

## 2020-09-20 DIAGNOSIS — R7989 Other specified abnormal findings of blood chemistry: Secondary | ICD-10-CM

## 2020-09-20 DIAGNOSIS — M25562 Pain in left knee: Secondary | ICD-10-CM

## 2020-09-20 DIAGNOSIS — M19042 Primary osteoarthritis, left hand: Secondary | ICD-10-CM

## 2020-09-20 DIAGNOSIS — M5412 Radiculopathy, cervical region: Secondary | ICD-10-CM

## 2020-09-20 DIAGNOSIS — M0579 Rheumatoid arthritis with rheumatoid factor of multiple sites without organ or systems involvement: Secondary | ICD-10-CM

## 2020-09-20 DIAGNOSIS — G5602 Carpal tunnel syndrome, left upper limb: Secondary | ICD-10-CM

## 2020-09-20 DIAGNOSIS — Z79899 Other long term (current) drug therapy: Secondary | ICD-10-CM | POA: Diagnosis not present

## 2020-09-20 DIAGNOSIS — G4733 Obstructive sleep apnea (adult) (pediatric): Secondary | ICD-10-CM

## 2020-09-20 DIAGNOSIS — R760 Raised antibody titer: Secondary | ICD-10-CM

## 2020-09-20 DIAGNOSIS — G8929 Other chronic pain: Secondary | ICD-10-CM | POA: Diagnosis not present

## 2020-09-20 DIAGNOSIS — M7061 Trochanteric bursitis, right hip: Secondary | ICD-10-CM

## 2020-09-20 DIAGNOSIS — R0602 Shortness of breath: Secondary | ICD-10-CM

## 2020-09-20 DIAGNOSIS — M19041 Primary osteoarthritis, right hand: Secondary | ICD-10-CM | POA: Diagnosis not present

## 2020-09-20 DIAGNOSIS — M1711 Unilateral primary osteoarthritis, right knee: Secondary | ICD-10-CM

## 2020-09-20 DIAGNOSIS — M7711 Lateral epicondylitis, right elbow: Secondary | ICD-10-CM

## 2020-09-20 DIAGNOSIS — Z8659 Personal history of other mental and behavioral disorders: Secondary | ICD-10-CM

## 2020-09-20 NOTE — Patient Instructions (Signed)
Vaccines You are taking a medication(s) that can suppress your immune system.  The following immunizations are recommended: Flu annually Covid-19  Td/Tdap (tetanus, diphtheria, pertussis) every 10 years Pneumonia (Prevnar 15 then Pneumovax 23 at least 1 year apart.  Alternatively, can take Prevnar 20 without needing additional dose) Shingrix (after age 44): 2 doses from 4 weeks to 6 months apart  Please check with your PCP to make sure you are up to date.   Heart Disease Prevention   Your inflammatory disease increases your risk of heart disease which includes heart attack, stroke, atrial fibrillation (irregular heartbeats), high blood pressure, heart failure and atherosclerosis (plaque in the arteries).  It is important to reduce your risk by:   Keep blood pressure, cholesterol, and blood sugar at healthy levels   Smoking Cessation   Maintain a healthy weight  BMI 20-25   Eat a healthy diet  Plenty of fresh fruit, vegetables, and whole grains  Limit saturated fats, foods high in sodium, and added sugars  DASH and Mediterranean diet   Increase physical activity  Recommend moderate physically activity for 150 minutes per week/ 30 minutes a day for five days a week These can be broken up into three separate ten-minute sessions during the day.   Reduce Stress  Meditation, slow breathing exercises, yoga, coloring books  Dental visits twice a year   

## 2020-09-22 LAB — COMPLETE METABOLIC PANEL WITH GFR
AG Ratio: 1.5 (calc) (ref 1.0–2.5)
ALT: 14 U/L (ref 6–29)
AST: 14 U/L (ref 10–30)
Albumin: 4.1 g/dL (ref 3.6–5.1)
Alkaline phosphatase (APISO): 70 U/L (ref 31–125)
BUN: 13 mg/dL (ref 7–25)
CO2: 24 mmol/L (ref 20–32)
Calcium: 9.2 mg/dL (ref 8.6–10.2)
Chloride: 106 mmol/L (ref 98–110)
Creat: 0.93 mg/dL (ref 0.50–0.99)
Globulin: 2.8 g/dL (calc) (ref 1.9–3.7)
Glucose, Bld: 96 mg/dL (ref 65–99)
Potassium: 4.2 mmol/L (ref 3.5–5.3)
Sodium: 140 mmol/L (ref 135–146)
Total Bilirubin: 0.5 mg/dL (ref 0.2–1.2)
Total Protein: 6.9 g/dL (ref 6.1–8.1)
eGFR: 78 mL/min/{1.73_m2} (ref >=60)

## 2020-09-22 LAB — ANA: Anti Nuclear Antibody (ANA): NEGATIVE

## 2020-09-22 LAB — CBC WITH DIFFERENTIAL/PLATELET
Absolute Monocytes: 534 cells/uL (ref 200–950)
Basophils Absolute: 64 cells/uL (ref 0–200)
Basophils Relative: 0.7 %
Eosinophils Absolute: 221 cells/uL (ref 15–500)
Eosinophils Relative: 2.4 %
HCT: 36.8 % (ref 35.0–45.0)
Hemoglobin: 11.9 g/dL (ref 11.7–15.5)
Lymphs Abs: 1546 cells/uL (ref 850–3900)
MCH: 24.8 pg — ABNORMAL LOW (ref 27.0–33.0)
MCHC: 32.3 g/dL (ref 32.0–36.0)
MCV: 76.8 fL — ABNORMAL LOW (ref 80.0–100.0)
MPV: 9 fL (ref 7.5–12.5)
Monocytes Relative: 5.8 %
Neutro Abs: 6836 cells/uL (ref 1500–7800)
Neutrophils Relative %: 74.3 %
Platelets: 356 10*3/uL (ref 140–400)
RBC: 4.79 10*6/uL (ref 3.80–5.10)
RDW: 14.4 % (ref 11.0–15.0)
Total Lymphocyte: 16.8 %
WBC: 9.2 10*3/uL (ref 3.8–10.8)

## 2020-09-22 LAB — SEDIMENTATION RATE: Sed Rate: 19 mm/h (ref 0–20)

## 2020-09-22 LAB — C3 AND C4
C3 Complement: 194 mg/dL — ABNORMAL HIGH (ref 83–193)
C4 Complement: 21 mg/dL (ref 15–57)

## 2020-09-22 LAB — ANTI-DNA ANTIBODY, DOUBLE-STRANDED: ds DNA Ab: <1 IU/mL

## 2020-09-22 LAB — CARDIOLIPIN ANTIBODIES, IGG, IGM, IGA
Anticardiolipin IgA: 3.8 APL-U/mL
Anticardiolipin IgG: <2 GPL-U/mL
Anticardiolipin IgM: <2 MPL-U/mL

## 2020-09-22 LAB — BETA-2 GLYCOPROTEIN ANTIBODIES
Beta-2 Glyco 1 IgA: 4.1 U/mL
Beta-2 Glyco 1 IgM: <2 U/mL
Beta-2 Glyco I IgG: <2 U/mL

## 2020-09-22 NOTE — Progress Notes (Signed)
CBC is normal, CMP is normal, ANA is negative, dsDNA negative, complements are normal, sed rate is normal, beta-2 GP 1 is negative, anticardiolipin's negative

## 2020-09-28 ENCOUNTER — Encounter: Payer: Self-pay | Admitting: Rheumatology

## 2020-09-30 ENCOUNTER — Ambulatory Visit (HOSPITAL_BASED_OUTPATIENT_CLINIC_OR_DEPARTMENT_OTHER)
Admission: RE | Admit: 2020-09-30 | Discharge: 2020-09-30 | Disposition: A | Discharge status: Home / Self Care | Payer: Managed Care, Other (non HMO) | Source: Ambulatory Visit | Attending: Rheumatology | Admitting: Rheumatology

## 2020-09-30 ENCOUNTER — Other Ambulatory Visit: Payer: Self-pay

## 2020-09-30 DIAGNOSIS — R0602 Shortness of breath: Secondary | ICD-10-CM | POA: Diagnosis not present

## 2020-10-01 NOTE — Progress Notes (Signed)
Chest xray is normal

## 2020-10-17 ENCOUNTER — Other Ambulatory Visit: Payer: Self-pay | Admitting: Physician Assistant

## 2020-10-17 DIAGNOSIS — M0579 Rheumatoid arthritis with rheumatoid factor of multiple sites without organ or systems involvement: Secondary | ICD-10-CM

## 2020-10-17 NOTE — Telephone Encounter (Signed)
Last Visit: 09/20/2020  Next Visit: 02/21/2021   Labs: 09/20/2020 CBC is normal, CMP is normal, ANA is negative, dsDNA negative, complements arenormal, sed rate is normal, beta-2 GP 1 is negative, anticardiolipin's negative  Eye exam: 10/14/2019 WNL   Current Dose per office note 09/20/2020: Plaquenil 200 mg 1 tablet by mouth twice daily.  PZ:WCHENIDPOE arthritis involving multiple sites with positive rheumatoid factor  Last Fill: 11/19/2019  Okay to refill Plaquenil?

## 2020-11-07 ENCOUNTER — Encounter: Payer: Self-pay | Admitting: Rheumatology

## 2020-11-07 NOTE — Telephone Encounter (Signed)
I returned patient's call.  She states she is having pain in her foot but no swelling.  She denies any history of trauma.  I advised her to discuss with her GYN and PCP.  If no other etiology is established then she should call us to schedule an appointment.

## 2020-11-18 DIAGNOSIS — I2699 Other pulmonary embolism without acute cor pulmonale: Secondary | ICD-10-CM

## 2020-11-18 HISTORY — DX: Other pulmonary embolism without acute cor pulmonale: I26.99

## 2021-02-07 NOTE — Progress Notes (Signed)
Office Visit Note  Patient: Renee Howard             Date of Birth: 07-31-1975           MRN: [id number]             PCP: Oren Section, NP-C Referring: Oren Section, NP-C Visit Date: 02/21/2021 Occupation: @GUAROCC @  Subjective:  History of bilateral pulmonary embolism  History of Present Illness: Chakayla Sasnett is a 45 y.o. female with history of seropositive rheumatoid arthritis and osteoarthritis.  She is taking plaquenil 200 mg 1 tablet by mouth twice daily.  She is tolerating Plaquenil without any side effects and denies missing any doses recently.  She denies any signs or symptoms of a rheumatoid arthritis flare.  She is not experiencing any joint swelling at this time.  She has not had any nocturnal pain.  Her morning stiffness has been lasting about 15 minutes daily. She reports that on 11/18/2020 she presented to the emergency department with shortness of breath and left calf pain.  She was diagnosed with a DVT and bilateral pulmonary embolism.  She was started on Eliquis which she has been tolerating without side effects.  She will be following up with vascular surgery at the 35-month mark.   Activities of Daily Living:  Patient reports morning stiffness for 15 minutes.   Patient Denies nocturnal pain.  Difficulty dressing/grooming: Reports Difficulty climbing stairs: Reports Difficulty getting out of chair: Reports Difficulty using hands for taps, buttons, cutlery, and/or writing: Denies  Review of Systems  Constitutional:  Positive for fatigue.  HENT:  Negative for mouth sores, mouth dryness and nose dryness.   Eyes:  Negative for pain, itching and dryness.  Respiratory:  Positive for shortness of breath and difficulty breathing.   Cardiovascular:  Negative for chest pain and palpitations.  Gastrointestinal:  Negative for blood in stool, constipation and diarrhea.  Endocrine: Negative for increased urination.  Genitourinary:  Negative for difficulty  urinating.  Musculoskeletal:  Positive for morning stiffness. Negative for joint pain, joint pain, joint swelling, myalgias, muscle tenderness and myalgias.  Skin:  Negative for color change, rash and redness.  Allergic/Immunologic: Negative for susceptible to infections.  Neurological:  Positive for numbness and weakness. Negative for dizziness, headaches and memory loss.  Hematological:  Positive for bruising/bleeding tendency.  Psychiatric/Behavioral:  Negative for confusion.    PMFS History:  Patient Active Problem List   Diagnosis Date Noted   Primary osteoarthritis of both hands 12/26/2016   High risk medication use 06/28/2016   Anticardiolipin antibody positive 06/18/2016   Elevated beta-2  06/18/2016   Numbness 09/03/2014   Urinary frequency 09/03/2014   Gait disturbance 09/03/2014   Arm pain, left 09/03/2014   Carpal tunnel syndrome 09/03/2014   Cervical radiculitis 09/03/2014   Rheumatoid arthritis involving multiple sites with positive rheumatoid factor ([place]) 09/03/2014    Past Medical History:  Diagnosis Date   Anticardiolipin antibody positive 06/18/2016   Anxiety    Arm pain, left 09/03/2014   Arthritis    Carpal tunnel syndrome 09/03/2014   Cervical radiculitis 09/03/2014   Depression    DVT (deep venous thrombosis) (HCC)    Dyspnea on exertion    Elevated beta-2  06/18/2016   Gait disturbance 09/03/2014   Low back pain    No pertinent past medical history    Numbness 09/03/2014   PONV (postoperative nausea and vomiting)    Primary osteoarthritis of both hands 12/26/2016   Pulmonary embolism (  [place]) 11/18/2020   per patient   Rheumatoid arthritis involving multiple sites with positive rheumatoid factor ([place]) 09/03/2014   Urinary frequency 09/03/2014    Family History  Problem Relation Age of Onset   Gallbladder disease Mother    Gallstones Father    Hypertension Father    Asperger's syndrome Brother    Cancer - Ovarian Paternal Grandmother    Healthy  Son    Past Surgical History:  Procedure Laterality Date   CESAREAN SECTION     2009   [place]   Social History   Social History Narrative   Not on file   Immunization History  Administered Date(s) Administered   Moderna Sars-Covid-2 Vaccination 05/23/2019, 06/23/2019   PFIZER(Purple Top)SARS-COV-2 Vaccination 01/04/2020     Objective: Vital Signs: BP (!) 147/83 (BP Location: Left Wrist, Patient Position: Sitting, Cuff Size: Large)   Pulse 71   Ht 5\' 10"  (1.778 m)   Wt (!) 370 lb 9.6 oz (168.1 kg)   BMI 53.18 kg/m    Physical Exam Vitals and nursing note reviewed.  Constitutional:      Appearance: She is well-developed.  HENT:     Head: Normocephalic and atraumatic.  Eyes:     Conjunctiva/sclera: Conjunctivae normal.  Pulmonary:     Effort: Pulmonary effort is normal.  Abdominal:     Palpations: Abdomen is soft.  Musculoskeletal:     Cervical back: Normal range of motion.  Skin:    General: Skin is warm and dry.     Capillary Refill: Capillary refill takes less than 2 seconds.  Neurological:     Mental Status: She is alert and oriented to person, place, and time.  Psychiatric:        Behavior: Behavior normal.     Musculoskeletal Exam: C-spine has good ROM with no discomfort.  Shoulder joints, elbow joints, wrist joints, MCPs, PIPs, and DIPs good ROM with no synovitis.  Complete fist formation bilaterally.  Hip joints, knee joints, and ankle joints have good ROM with no discomfort.  No warmth or effusion of knee joints.  No tenderness or swelling of ankle joints.  Pedal edema bilaterally, left greater than right.   CDAI Exam: CDAI Score: 0.2  Patient Global: 1 mm; Provider Global: 1 mm Swollen: 0 ; Tender: 0  Joint Exam 02/21/2021   No joint exam has been documented for this visit   There is currently no information documented on the homunculus. Go to the Rheumatology activity and complete the homunculus joint exam.  Investigation: No  additional findings.  Imaging: No results found.  Recent Labs: Lab Results  Component Value Date   WBC 9.2 09/20/2020   HGB 11.9 09/20/2020   PLT 356 09/20/2020   NA 140 09/20/2020   K 4.2 09/20/2020   CL 106 09/20/2020   CO2 24 09/20/2020   GLUCOSE 96 09/20/2020   BUN 13 09/20/2020   CREATININE 0.93 09/20/2020   BILITOT 0.5 09/20/2020   ALKPHOS 70 10/06/2013   AST 14 09/20/2020   ALT 14 09/20/2020   PROT 6.9 09/20/2020   ALBUMIN 3.9 10/06/2013   CALCIUM 9.2 09/20/2020   GFRAA 94 04/21/2020    Speciality Comments: PLQ eye exam: 10/19/2020  WNL. [facility]. Follow up in 6 months.   Procedures:  No procedures performed Allergies: Patient has no known allergies.   Assessment / Plan:     Visit Diagnoses: Rheumatoid arthritis involving multiple sites with positive rheumatoid factor ([place]) -  Positive RF, positive ANA: She has no joint tenderness or synovitis.  She has not had any signs or symptoms of a rheumatoid arthritis flare.  She is clinically doing well on Plaquenil 200 mg 1 tablet by mouth twice daily.  She is tolerating Plaquenil without any side effects.  Her morning stiffness has been lasting 15 minutes daily.  She has not had any nocturnal pain.  She experiences occasional pain and stiffness in her knee joints and has crepitus on examination today.  No warmth or effusion of knee joints noted.  She has not had any difficulty with ADLs.  She will remain on Plaquenil as prescribed.  She will follow up in 5 months.   High risk medication use - Plaquenil 200 mg 1 tablet by mouth twice daily. BMP and CBC updated on 11/20/20.  CBC and CMP will be updated today to monitor for drug toxicity.  PLQ eye exam: 10/19/2020 WNL. [facility]. Follow up in 6 months.  - Plan: CBC with Differential/Platelet, COMPLETE METABOLIC PANEL WITH GFR  Primary osteoarthritis of both hands: Mild DIP and PIP prominence consistent with osteoarthritis of both hands.  No tenderness or  inflammation was noted on examination today.  Complete fist formation bilaterally.  Discussed the importance of joint protection and muscle strengthening.  Chronic pain of left knee - The x-ray showed mild chondromalacia patella. She has good ROM with some crepitus.  No warmth or effusion noted.   Primary osteoarthritis of right knee - X-ray showed mild osteoarthritis and mild chondromalacia patella.  She has painful ROM of both knee joints with some crepitus.  No warmth or effusion noted.   Anticardiolipin antibody positive - She initially had positive anticardiolipin and beta-2 antibodies.  Repeat antibody testing was negative in 06/24/2016 and 09/20/2020.   Elevated beta-2  - Negative in 24-Jun-2016 and on 09/20/2020.  Bilateral pulmonary embolism (HCC): She presented to the ED on 11/18/2020 with left lower leg pain and shortness of breath.  She was diagnosed with bilateral pulmonary embolism and left lower extremity DVT. She previously had a DVT 20 years ago which was unprovoked. She was started on Eliquis during the admission and was advised to follow-up outpatient with hematology for hypercoagulable work-up.  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   Hx of Beta-2 glycoprotein and anticardiolipin antibody positivity.  Beta-2 glycoprotein antibodies and anticardiolipin antibodies have been negative since 24-Jun-2016.  These antibodies were rechecked on 09/20/2020 prior to the DVT and pulmonary embolisms at which time his antibodies were negative. She will remain on Eliquis as prescribed.  Other medical conditions are listed as follows:   History of depression: She was recently started on pristiq.    Obstructive sleep apnea syndrome  Shortness of breath  Orders: Orders Placed This Encounter  Procedures   CBC with Differential/Platelet   COMPLETE METABOLIC PANEL  WITH GFR   No orders of the defined types were placed in this encounter.   Follow-Up Instructions: Return in about 5 months (around 07/22/2021) for Rheumatoid arthritis, Osteoarthritis.   Gearldine Bienenstock, PA-C  Note - This record has been created using Dragon software.  Chart creation errors have been sought, but may not always  have been located. Such creation errors do not reflect on  the standard of medical care.

## 2021-02-11 ENCOUNTER — Other Ambulatory Visit: Payer: Self-pay | Admitting: Rheumatology

## 2021-02-11 DIAGNOSIS — M0579 Rheumatoid arthritis with rheumatoid factor of multiple sites without organ or systems involvement: Secondary | ICD-10-CM

## 2021-02-13 NOTE — Telephone Encounter (Signed)
Next Visit: 02/21/2021  Last Visit: 09/20/2020  Labs: 11/20/2020 Hgb 10.8, Hct 33.5, MCV 75.0, MCH 24.3, MCHC 32.3, CO2 22, Calcium 8.3  Eye exam: 10/19/2020  WNL.   Current Dose per office note 09/20/2020: Plaquenil 200 mg 1 tablet by mouth twice daily.  SH:[id number] arthritis involving multiple sites with positive rheumatoid factor   Last Fill: 10/17/2020  Okay to refill Plaquenil?

## 2021-02-21 ENCOUNTER — Encounter: Payer: Self-pay | Admitting: Physician Assistant

## 2021-02-21 ENCOUNTER — Other Ambulatory Visit: Payer: Self-pay

## 2021-02-21 ENCOUNTER — Ambulatory Visit: Payer: Managed Care, Other (non HMO) | Admitting: Physician Assistant

## 2021-02-21 VITALS — BP 147/83 | HR 71 | Ht 70.0 in | Wt 370.6 lb

## 2021-02-21 DIAGNOSIS — M0579 Rheumatoid arthritis with rheumatoid factor of multiple sites without organ or systems involvement: Secondary | ICD-10-CM | POA: Diagnosis not present

## 2021-02-21 DIAGNOSIS — I2699 Other pulmonary embolism without acute cor pulmonale: Secondary | ICD-10-CM

## 2021-02-21 DIAGNOSIS — Z8659 Personal history of other mental and behavioral disorders: Secondary | ICD-10-CM

## 2021-02-21 DIAGNOSIS — M25562 Pain in left knee: Secondary | ICD-10-CM

## 2021-02-21 DIAGNOSIS — Z79899 Other long term (current) drug therapy: Secondary | ICD-10-CM | POA: Diagnosis not present

## 2021-02-21 DIAGNOSIS — R0602 Shortness of breath: Secondary | ICD-10-CM

## 2021-02-21 DIAGNOSIS — M19042 Primary osteoarthritis, left hand: Secondary | ICD-10-CM

## 2021-02-21 DIAGNOSIS — M1711 Unilateral primary osteoarthritis, right knee: Secondary | ICD-10-CM

## 2021-02-21 DIAGNOSIS — M19041 Primary osteoarthritis, right hand: Secondary | ICD-10-CM | POA: Diagnosis not present

## 2021-02-21 DIAGNOSIS — R7989 Other specified abnormal findings of blood chemistry: Secondary | ICD-10-CM

## 2021-02-21 DIAGNOSIS — R760 Raised antibody titer: Secondary | ICD-10-CM

## 2021-02-21 DIAGNOSIS — G4733 Obstructive sleep apnea (adult) (pediatric): Secondary | ICD-10-CM

## 2021-02-21 DIAGNOSIS — G8929 Other chronic pain: Secondary | ICD-10-CM

## 2021-02-22 LAB — CBC WITH DIFFERENTIAL/PLATELET
Absolute Monocytes: 462 cells/uL (ref 200–950)
Basophils Absolute: 42 cells/uL (ref 0–200)
Basophils Relative: 0.6 %
Eosinophils Absolute: 133 cells/uL (ref 15–500)
Eosinophils Relative: 1.9 %
HCT: 36.1 % (ref 35.0–45.0)
Hemoglobin: 11.6 g/dL — ABNORMAL LOW (ref 11.7–15.5)
Lymphs Abs: 1309 cells/uL (ref 850–3900)
MCH: 25.4 pg — ABNORMAL LOW (ref 27.0–33.0)
MCHC: 32.1 g/dL (ref 32.0–36.0)
MCV: 79.2 fL — ABNORMAL LOW (ref 80.0–100.0)
MPV: 9.3 fL (ref 7.5–12.5)
Monocytes Relative: 6.6 %
Neutro Abs: 5054 cells/uL (ref 1500–7800)
Neutrophils Relative %: 72.2 %
Platelets: 331 10*3/uL (ref 140–400)
RBC: 4.56 10*6/uL (ref 3.80–5.10)
RDW: 14.5 % (ref 11.0–15.0)
Total Lymphocyte: 18.7 %
WBC: 7 10*3/uL (ref 3.8–10.8)

## 2021-02-22 LAB — COMPLETE METABOLIC PANEL WITH GFR
AG Ratio: 1.3 (calc) (ref 1.0–2.5)
ALT: 15 U/L (ref 6–29)
AST: 15 U/L (ref 10–30)
Albumin: 3.8 g/dL (ref 3.6–5.1)
Alkaline phosphatase (APISO): 65 U/L (ref 31–125)
BUN: 13 mg/dL (ref 7–25)
CO2: 26 mmol/L (ref 20–32)
Calcium: 8.9 mg/dL (ref 8.6–10.2)
Chloride: 103 mmol/L (ref 98–110)
Creat: 0.75 mg/dL (ref 0.50–0.99)
Globulin: 2.9 g/dL (calc) (ref 1.9–3.7)
Glucose, Bld: 106 mg/dL — ABNORMAL HIGH (ref 65–99)
Potassium: 4.6 mmol/L (ref 3.5–5.3)
Sodium: 138 mmol/L (ref 135–146)
Total Bilirubin: 0.4 mg/dL (ref 0.2–1.2)
Total Protein: 6.7 g/dL (ref 6.1–8.1)
eGFR: 101 mL/min/{1.73_m2} (ref >=60)

## 2021-02-22 NOTE — Progress Notes (Signed)
Glucose is 106. Rest of CMP WNL.  Hemoglobin is borderline low-11.6. MCV and MCH are slightly low.  Rest of CBC WNL.   Hemoglobin was low-10.8 on 11/20/20.  Anemia is improving.

## 2021-07-11 NOTE — Progress Notes (Signed)
? ?Office Visit Note ? ?Patient: Renee Howard             ?Date of Birth: May 19, 1975           ?MRN: [id number]             ?PCP: Charisse March, NP-C ?Referring: Charisse March, NP-C ?Visit Date: 07/25/2021 ?Occupation: @GUAROCC @ ? ?Subjective:  ?Joint pain and stiffness ? ?History of Present Illness: Renee Howard is a 46 y.o. female history of seropositive rheumatoid arthritis.  She states she continues to have some discomfort in her bilateral hands and her bilateral knee joints.  She has occasional discomfort in her feet.  She has not noticed any joint swelling.  She states a few weeks back she sprained her back and was given a prednisone taper.  She noticed improvement in all of her pain symptoms.  She is wondering if she should be on more aggressive therapy for rheumatoid arthritis.  She has been taking hydroxychloroquine on a regular basis.  She denies any history of oral ulcers, nasal ulcers, malar rash, photosensitivity, Raynaud's phenomenon or lymphadenopathy. ? ?Activities of Daily Living:  ?Patient reports morning stiffness for 15 minutes.   ?Patient Reports nocturnal pain.  ?Difficulty dressing/grooming: Reports ?Difficulty climbing stairs: Reports ?Difficulty getting out of chair: Denies ?Difficulty using hands for taps, buttons, cutlery, and/or writing: Denies ? ?Review of Systems  ?Constitutional:  Positive for fatigue.  ?HENT:  Negative for mouth sores, mouth dryness and nose dryness.   ?Eyes:  Positive for dryness. Negative for pain and itching.  ?Respiratory:  Negative for difficulty breathing.   ?Cardiovascular:  Negative for chest pain and palpitations.  ?Gastrointestinal:  Negative for blood in stool, constipation and diarrhea.  ?Endocrine: Negative for increased urination.  ?Genitourinary:  Negative for difficulty urinating.  ?Musculoskeletal:  Positive for morning stiffness. Negative for joint pain, joint pain, joint swelling, myalgias, muscle tenderness and myalgias.  ?Skin:   Negative for color change, rash and redness.  ?Allergic/Immunologic: Negative for susceptible to infections.  ?Neurological:  Positive for numbness. Negative for dizziness, headaches, memory loss and weakness.  ?Hematological:  Positive for bruising/bleeding tendency.  ?Psychiatric/Behavioral:  Negative for confusion.   ? ?PMFS History:  ?Patient Active Problem List  ? Diagnosis Date Noted  ? Primary osteoarthritis of both hands 12/26/2016  ? High risk medication use 06/28/2016  ? Anticardiolipin antibody positive 06/18/2016  ? Elevated beta-2  06/18/2016  ? Numbness 09/03/2014  ? Urinary frequency 09/03/2014  ? Gait disturbance 09/03/2014  ? Arm pain, left 09/03/2014  ? Carpal tunnel syndrome 09/03/2014  ? Cervical radiculitis 09/03/2014  ? Rheumatoid arthritis involving multiple sites with positive rheumatoid factor (HCC) 09/03/2014  ?  ?Past Medical History:  ?Diagnosis Date  ? Anticardiolipin antibody positive 06/18/2016  ? Anxiety   ? Arm pain, left 09/03/2014  ? Arthritis   ? Carpal tunnel syndrome 09/03/2014  ? Cervical radiculitis 09/03/2014  ? Depression   ? DVT (deep venous thrombosis) (HCC)   ? Dyspnea on exertion   ? Elevated beta-2  06/18/2016  ? Gait disturbance 09/03/2014  ? Low back pain   ? No pertinent past medical history   ? Numbness 09/03/2014  ? PONV (postoperative nausea and vomiting)   ? Primary osteoarthritis of both hands 12/26/2016  ? Pulmonary embolism (HCC) 11/18/2020  ? per patient  ? Rheumatoid arthritis involving multiple sites with positive rheumatoid factor (HCC) 09/03/2014  ? Urinary frequency 09/03/2014  ?  ?Family History  ?Problem Relation  Age of Onset  ? Gallbladder disease Mother   ? Gallstones Father   ? Hypertension Father   ? Asperger's syndrome Brother   ? Cancer - Ovarian Paternal Grandmother   ? Healthy Son   ? ?Past Surgical History:  ?Procedure Laterality Date  ? CESAREAN SECTION    ? 2009  ? HERNIA REPAIR    ? 1978  ? ?Social History  ? ?Social History Narrative  ?  Not on file  ? ?Immunization History  ?Administered Date(s) Administered  ? Moderna Sars-Covid-2 Vaccination 05/23/2019, 06/23/2019  ? PFIZER(Purple Top)SARS-COV-2 Vaccination 01/04/2020  ?  ? ?Objective: ?Vital Signs: BP (!) 143/80 (BP Location: Left Wrist, Patient Position: Sitting, Cuff Size: Normal)   Pulse 74   Ht  (1.778 m)   Wt (!) 376 lb 12.8 oz (170.9 kg)   BMI 54.07 kg/m?   ? ?Physical Exam ?Vitals and nursing note reviewed.  ?Constitutional:   ?   Appearance: She is well-developed.  ?HENT:  ?   Head: Normocephalic and atraumatic.  ?Eyes:  ?   Conjunctiva/sclera: Conjunctivae normal.  ?Cardiovascular:  ?   Rate and Rhythm: Normal rate and regular rhythm.  ?   Heart sounds: Normal heart sounds.  ?Pulmonary:  ?   Effort: Pulmonary effort is normal.  ?   Breath sounds: Normal breath sounds.  ?Abdominal:  ?   General: Bowel sounds are normal.  ?   Palpations: Abdomen is soft.  ?Musculoskeletal:  ?   Cervical back: Normal range of motion.  ?Lymphadenopathy:  ?   Cervical: No cervical adenopathy.  ?Skin: ?   General: Skin is warm and dry.  ?   Capillary Refill: Capillary refill takes less than 2 seconds.  ?Neurological:  ?   Mental Status: She is alert and oriented to person, place, and time.  ?Psychiatric:     ?   Behavior: Behavior normal.  ?  ? ?Musculoskeletal Exam: Muscle good range of motion.  Shoulder joints, elbow joints, wrist joints, MCPs PIPs and DIPs with good range of motion with no synovitis.  She had bilateral DIP thickening consistent with osteoarthritis.  Hip joints and knee joints with good range of motion.  No warmth swelling or effusion was noted in the knee joints.  There was no tenderness over ankles or MTPs. ? ?CDAI Exam: ?CDAI Score: 0.4  ?Patient Global: 2 mm; Provider Global: 2 mm ?Swollen: 0 ; Tender: 0  ?Joint Exam 07/25/2021  ? ?No joint exam has been documented for this visit  ? ?There is currently no information documented on the homunculus. Go to the Rheumatology  activity and complete the homunculus joint exam. ? ?Investigation: ?No additional findings. ? ?Imaging: ?No results found. ? ?Recent Labs: ?Lab Results  ?Component Value Date  ? WBC 7.0 02/21/2021  ? HGB 11.6 (L) 02/21/2021  ? PLT 331 02/21/2021  ? NA 138 02/21/2021  ? K 4.6 02/21/2021  ? CL 103 02/21/2021  ? CO2 26 02/21/2021  ? GLUCOSE 106 (H) 02/21/2021  ? BUN 13 02/21/2021  ? CREATININE 0.75 02/21/2021  ? BILITOT 0.4 02/21/2021  ? ALKPHOS 70 10/06/2013  ? AST 15 02/21/2021  ? ALT 15 02/21/2021  ? PROT 6.7 02/21/2021  ? ALBUMIN 3.9 10/06/2013  ? CALCIUM 8.9 02/21/2021  ? GFRAA 94 04/21/2020  ? ? ?Speciality Comments: PLQ eye exam: 04/26/2021 [facility]. Follow up in 6 months. ? ? ?Procedures:  ?No procedures performed ?Allergies: Patient has no known allergies.  ? ?Assessment /  Plan:     ?Visit Diagnoses: Rheumatoid arthritis involving multiple sites with positive rheumatoid factor (HCC) - Positive RF, positive ANA:  -She continues to have some stiffness in her hands and her knee joints.  No synovitis was noted.  Patient states she took prednisone recently for a lumbar strain which caused improvement in her overall discomfort.  She was wondering if she needs more aggressive therapy.  I do not see any synovitis on examination.  I offered x-rays of her bilateral hands and feet which she declined.  I also offered an ultrasound to evaluate for synovitis.  She will let me know when she thinks about it.  I will check sedimentation rate today.  Plan: Cyclic citrul peptide antibody, IgG, Rheumatoid factor ? ?High risk medication use - Plaquenil 200 mg 1 tablet by mouth twice daily. PLQ eye exam: 04/26/2021 -Labs obtained on February 21, 2021 were normal except for mild anemia.  We will check labs today.  Plan: CBC with Differential/Platelet, COMPLETE METABOLIC PANEL WITH GFR.  Information about immunization was placed in the AVS. ? ?Primary osteoarthritis of both hands-she has bilateral DIP thickening  consistent with osteoarthritis.  Her discomfort is mostly in the PIP and DIP joints. ? ?Chronic pain of left knee - The x-ray showed mild chondromalacia patella.  She continues to have some knee joint discomfort.  Lower e

## 2021-07-25 ENCOUNTER — Ambulatory Visit: Payer: Managed Care, Other (non HMO) | Admitting: Rheumatology

## 2021-07-25 ENCOUNTER — Encounter: Payer: Self-pay | Admitting: Rheumatology

## 2021-07-25 VITALS — BP 143/80 | HR 74 | Ht 70.0 in | Wt 376.8 lb

## 2021-07-25 DIAGNOSIS — M0579 Rheumatoid arthritis with rheumatoid factor of multiple sites without organ or systems involvement: Secondary | ICD-10-CM

## 2021-07-25 DIAGNOSIS — Z6841 Body Mass Index (BMI) 40.0 and over, adult: Secondary | ICD-10-CM

## 2021-07-25 DIAGNOSIS — M25562 Pain in left knee: Secondary | ICD-10-CM

## 2021-07-25 DIAGNOSIS — G4733 Obstructive sleep apnea (adult) (pediatric): Secondary | ICD-10-CM

## 2021-07-25 DIAGNOSIS — Z79899 Other long term (current) drug therapy: Secondary | ICD-10-CM | POA: Diagnosis not present

## 2021-07-25 DIAGNOSIS — M1711 Unilateral primary osteoarthritis, right knee: Secondary | ICD-10-CM

## 2021-07-25 DIAGNOSIS — R0602 Shortness of breath: Secondary | ICD-10-CM

## 2021-07-25 DIAGNOSIS — R7989 Other specified abnormal findings of blood chemistry: Secondary | ICD-10-CM

## 2021-07-25 DIAGNOSIS — M19042 Primary osteoarthritis, left hand: Secondary | ICD-10-CM

## 2021-07-25 DIAGNOSIS — G8929 Other chronic pain: Secondary | ICD-10-CM

## 2021-07-25 DIAGNOSIS — M19041 Primary osteoarthritis, right hand: Secondary | ICD-10-CM

## 2021-07-25 DIAGNOSIS — R760 Raised antibody titer: Secondary | ICD-10-CM

## 2021-07-25 DIAGNOSIS — I2699 Other pulmonary embolism without acute cor pulmonale: Secondary | ICD-10-CM

## 2021-07-25 DIAGNOSIS — Z8659 Personal history of other mental and behavioral disorders: Secondary | ICD-10-CM

## 2021-07-25 NOTE — Patient Instructions (Signed)
Vaccines You are taking a medication(s) that can suppress your immune system.  The following immunizations are recommended: Flu annually Covid-19  Td/Tdap (tetanus, diphtheria, pertussis) every 10 years Pneumonia (Prevnar 15 then Pneumovax 23 at least 1 year apart.  Alternatively, can take Prevnar 20 without needing additional dose) Shingrix: 2 doses from 4 weeks to 6 months apart  Please check with your PCP to make sure you are up to date.  

## 2021-07-29 LAB — CBC WITH DIFFERENTIAL/PLATELET
Absolute Monocytes: 435 cells/uL (ref 200–950)
Basophils Absolute: 41 cells/uL (ref 0–200)
Basophils Relative: 0.5 %
Eosinophils Absolute: 197 cells/uL (ref 15–500)
Eosinophils Relative: 2.4 %
HCT: 36.2 % (ref 35.0–45.0)
Hemoglobin: 11.8 g/dL (ref 11.7–15.5)
Lymphs Abs: 1419 cells/uL (ref 850–3900)
MCH: 25.9 pg — ABNORMAL LOW (ref 27.0–33.0)
MCHC: 32.6 g/dL (ref 32.0–36.0)
MCV: 79.4 fL — ABNORMAL LOW (ref 80.0–100.0)
MPV: 8.7 fL (ref 7.5–12.5)
Monocytes Relative: 5.3 %
Neutro Abs: 6109 cells/uL (ref 1500–7800)
Neutrophils Relative %: 74.5 %
Platelets: 332 10*3/uL (ref 140–400)
RBC: 4.56 10*6/uL (ref 3.80–5.10)
RDW: 14.4 % (ref 11.0–15.0)
Total Lymphocyte: 17.3 %
WBC: 8.2 10*3/uL (ref 3.8–10.8)

## 2021-07-29 LAB — URINALYSIS, ROUTINE W REFLEX MICROSCOPIC
Bilirubin Urine: NEGATIVE
Glucose, UA: NEGATIVE
Hgb urine dipstick: NEGATIVE
Ketones, ur: NEGATIVE
Leukocytes,Ua: NEGATIVE
Nitrite: NEGATIVE
Protein, ur: NEGATIVE
Specific Gravity, Urine: 1.012 (ref 1.001–1.035)
pH: 6 (ref 5.0–8.0)

## 2021-07-29 LAB — CARDIOLIPIN ANTIBODIES, IGG, IGM, IGA
Anticardiolipin IgA: 4.5 APL-U/mL (ref <20.0)
Anticardiolipin IgG: <2 GPL-U/mL (ref <20.0)
Anticardiolipin IgM: <2 MPL-U/mL (ref <20.0)

## 2021-07-29 LAB — COMPLETE METABOLIC PANEL WITH GFR
AG Ratio: 1.4 (calc) (ref 1.0–2.5)
ALT: 17 U/L (ref 6–29)
AST: 16 U/L (ref 10–35)
Albumin: 3.8 g/dL (ref 3.6–5.1)
Alkaline phosphatase (APISO): 66 U/L (ref 31–125)
BUN: 14 mg/dL (ref 7–25)
CO2: 23 mmol/L (ref 20–32)
Calcium: 8.9 mg/dL (ref 8.6–10.2)
Chloride: 105 mmol/L (ref 98–110)
Creat: 0.84 mg/dL (ref 0.50–0.99)
Globulin: 2.8 g/dL (calc) (ref 1.9–3.7)
Glucose, Bld: 124 mg/dL — ABNORMAL HIGH (ref 65–99)
Potassium: 4.1 mmol/L (ref 3.5–5.3)
Sodium: 138 mmol/L (ref 135–146)
Total Bilirubin: 0.5 mg/dL (ref 0.2–1.2)
Total Protein: 6.6 g/dL (ref 6.1–8.1)
eGFR: 87 mL/min/{1.73_m2} (ref >=60)

## 2021-07-29 LAB — BETA-2 GLYCOPROTEIN ANTIBODIES
Beta-2 Glyco 1 IgA: 5 U/mL (ref <20.0)
Beta-2 Glyco 1 IgM: <2 U/mL (ref <20.0)
Beta-2 Glyco I IgG: <2 U/mL (ref <20.0)

## 2021-07-29 LAB — CYCLIC CITRUL PEPTIDE ANTIBODY, IGG: Cyclic Citrullin Peptide Ab: <16 UNITS

## 2021-07-29 LAB — C3 AND C4
C3 Complement: 202 mg/dL — ABNORMAL HIGH (ref 83–193)
C4 Complement: 21 mg/dL (ref 15–57)

## 2021-07-29 LAB — RHEUMATOID FACTOR: Rheumatoid fact SerPl-aCnc: <14 IU/mL (ref <14)

## 2021-07-29 LAB — ANA: Anti Nuclear Antibody (ANA): NEGATIVE

## 2021-07-29 LAB — SEDIMENTATION RATE: Sed Rate: 19 mm/h (ref 0–20)

## 2021-07-29 LAB — ANTI-DNA ANTIBODY, DOUBLE-STRANDED: ds DNA Ab: <1 IU/mL

## 2021-07-30 NOTE — Progress Notes (Signed)
CBC and CMP are normal except glucose is elevated at 124.  UA negative, complements normal, sed rate normal, ANA negative, dsDNA negative, beta-2 GP 1 negative, anticardiolipin negative, RF negative, anti-CCP negative.  All autoimmune labs are negative.  No change in treatment advised.  Sed rate is normal and does not indicate inflammation.

## 2021-08-12 ENCOUNTER — Other Ambulatory Visit: Payer: Self-pay | Admitting: Physician Assistant

## 2021-08-12 DIAGNOSIS — M0579 Rheumatoid arthritis with rheumatoid factor of multiple sites without organ or systems involvement: Secondary | ICD-10-CM

## 2021-08-13 NOTE — Telephone Encounter (Signed)
Next Visit: 12/27/2021  Last Visit: 07/25/2021  Labs: 07/25/2021, CBC and CMP are normal except glucose is elevated at 124.  UA negative, complements normal, sed rate normal, ANA negative, dsDNA negative, beta-2 GP 1 negative, anticardiolipin negative, RF negative, anti-CCP negative.  All autoimmune labs are negative.  No change in treatment advised.  Sed rate is normal and does not indicate inflammation.  Eye exam: 04/26/2021 [facility]. Follow up in 6 months.    Current Dose per office note 07/25/2021: Plaquenil 200 mg 1 tablet by mouth twice daily.  [id number] arthritis involving multiple sites with positive rheumatoid factor  Last Fill: 02/13/2021  Okay to refill Plaquenil?

## 2021-09-15 ENCOUNTER — Other Ambulatory Visit: Payer: Self-pay | Admitting: Internal Medicine

## 2021-10-12 ENCOUNTER — Other Ambulatory Visit: Payer: Self-pay | Admitting: Internal Medicine

## 2021-10-26 ENCOUNTER — Other Ambulatory Visit: Payer: Self-pay | Admitting: Internal Medicine

## 2021-12-13 NOTE — Progress Notes (Unsigned)
Office Visit Note  Patient: Renee Howard             Date of Birth: [birth year]           MRN: [id number]             PCP: Charisse March, NP-C Referring: Charisse March, NP-C Visit Date: 12/27/2021 Occupation: @GUAROCC @  Subjective:  Medication monitoring  History of Present Illness: Renee Howard is a 46 y.o. female with history of seropositive rheumatoid arthritis and osteoarthritis.  She is taking plaquenil 200 mg 1 tablet by mouth twice daily.  She is tolerating Plaquenil without any side effects and has not missed any doses recently.  She denies any signs or symptoms of a rheumatoid arthritis flare.  She states that she has noticed some increase stiffness in the left knee joint as well as popping intermittently.  She is also been having some increased discomfort in the right index finger DIP joint intermittently.  She denies any joint swelling at this time.  She takes Tylenol as needed for pain relief.  She states overall her rheumatoid arthritis remains stable on the current treatment regimen.  She denies any recent infections.  She had her flu shot on Friday.  She denies any new medical conditions.    Activities of Daily Living:  Patient reports morning stiffness for 10 minutes.   Patient Reports nocturnal pain.  Difficulty dressing/grooming: Reports Difficulty climbing stairs: Reports Difficulty getting out of chair: Reports Difficulty using hands for taps, buttons, cutlery, and/or writing: Denies  Review of Systems  Constitutional:  Positive for fatigue.  HENT:  Negative for mouth sores and mouth dryness.   Eyes:  Positive for dryness.  Respiratory:  Positive for shortness of breath.   Cardiovascular:  Negative for chest pain and palpitations.  Gastrointestinal:  Negative for blood in stool, constipation and diarrhea.  Endocrine: Negative for increased urination.  Genitourinary:  Negative for involuntary urination.  Musculoskeletal:  Positive for joint  pain, joint pain, joint swelling and morning stiffness. Negative for gait problem, myalgias, muscle weakness, muscle tenderness and myalgias.  Skin:  Positive for hair loss and sensitivity to sunlight. Negative for color change and rash.  Allergic/Immunologic: Negative for susceptible to infections.  Neurological:  Positive for dizziness and headaches.  Hematological:  Negative for swollen glands.  Psychiatric/Behavioral:  Positive for depressed mood and sleep disturbance. The patient is nervous/anxious.     PMFS History:  Patient Active Problem List   Diagnosis Date Noted   Primary osteoarthritis of both hands 12/26/2016   High risk medication use 06/28/2016   Anticardiolipin antibody positive 06/18/2016   Elevated beta-2  06/18/2016   Numbness 09/03/2014   Urinary frequency 09/03/2014   Gait disturbance 09/03/2014   Arm pain, left 09/03/2014   Carpal tunnel syndrome 09/03/2014   Cervical radiculitis 09/03/2014   Rheumatoid arthritis involving multiple sites with positive rheumatoid factor (HCC) 09/03/2014    Past Medical History:  Diagnosis Date   Anticardiolipin antibody positive 06/18/2016   Anxiety    Arm pain, left 09/03/2014   Arthritis    Carpal tunnel syndrome 09/03/2014   Cervical radiculitis 09/03/2014   Depression    DVT (deep venous thrombosis) (HCC)    Dyspnea on exertion    Elevated beta-2  06/18/2016   Gait disturbance 09/03/2014   Low back pain    No pertinent past medical history    Numbness 09/03/2014   PONV (postoperative nausea and vomiting)    Primary osteoarthritis  of both hands 12/26/2016   Pulmonary embolism (HCC) 11/18/2020   per patient   Rheumatoid arthritis involving multiple sites with positive rheumatoid factor (HCC) 09/03/2014   Urinary frequency 09/03/2014    Family History  Problem Relation Age of Onset   Gallbladder disease Mother    Gallstones Father    Hypertension Father    Asperger's syndrome Brother    Cancer - Ovarian  Paternal Grandmother    Healthy Son    Past Surgical History:  Procedure Laterality Date   CESAREAN SECTION     2009   HERNIA REPAIR     1978   Social History   Social History Narrative   Not on file   Immunization History  Administered Date(s) Administered   Moderna Sars-Covid-2 Vaccination 05/23/2019, 06/23/2019   PFIZER(Purple Top)SARS-COV-2 Vaccination 01/04/2020     Objective: Vital Signs: BP 124/77 (BP Location: Left Wrist, Patient Position: Sitting, Cuff Size: Normal)   Pulse 78   Resp 18   Ht 5\' 10"  (1.778 m)   Wt (!) 382 lb 6.4 oz (173.5 kg)   BMI 54.87 kg/m    Physical Exam Vitals and nursing note reviewed.  Constitutional:      Appearance: She is well-developed.  HENT:     Head: Normocephalic and atraumatic.  Eyes:     Conjunctiva/sclera: Conjunctivae normal.  Cardiovascular:     Rate and Rhythm: Normal rate and regular rhythm.     Heart sounds: Normal heart sounds.  Pulmonary:     Effort: Pulmonary effort is normal.     Breath sounds: Normal breath sounds.  Abdominal:     General: Bowel sounds are normal.     Palpations: Abdomen is soft.  Musculoskeletal:     Cervical back: Normal range of motion.  Skin:    General: Skin is warm and dry.     Capillary Refill: Capillary refill takes less than 2 seconds.  Neurological:     Mental Status: She is alert and oriented to person, place, and time.  Psychiatric:        Behavior: Behavior normal.      Musculoskeletal Exam: C-spine, thoracic spine, lumbar spine have good range of motion.  No midline spinal tenderness.  Shoulder joints have good range of motion with no discomfort.  Elbow joints have good range of motion no tenderness or synovitis.  Wrist joints, MCPs, PIPs, DIPs have good range of motion with no synovitis.  DIP prominence noted.  Tenderness over the DIP of the right index finger.  Complete fist formation bilaterally.  Hip joints have good range of motion with no groin pain.  No tenderness  over trochanteric bursa or IT bands.  Knee joints have good range of motion with no warmth or effusion.  Ankle joints have good range of motion with no joint tenderness.  Pedal edema noted bilaterally.  No tenderness over MTP joints.  CDAI Exam: CDAI Score: 1.2  Patient Global: 1 mm; Provider Global: 1 mm Swollen: 0 ; Tender: 2  Joint Exam 12/27/2021      Right  Left  DIP 2   Tender     Knee      Tender     Investigation: No additional findings.  Imaging: No results found.  Recent Labs: Lab Results  Component Value Date   WBC 8.2 07/25/2021   HGB 11.8 07/25/2021   PLT 332 07/25/2021   NA 138 07/25/2021   K 4.1 07/25/2021   CL 105 07/25/2021   CO2 23 07/25/2021  GLUCOSE 124 (H) 07/25/2021   BUN 14 07/25/2021   CREATININE 0.84 07/25/2021   BILITOT 0.5 07/25/2021   ALKPHOS 70 10/06/2013   AST 16 07/25/2021   ALT 17 07/25/2021   PROT 6.6 07/25/2021   ALBUMIN 3.9 10/06/2013   CALCIUM 8.9 07/25/2021   GFRAA 94 04/21/2020    Speciality Comments: PLQ eye exam: 11/01/2021 [facility]. Follow up in 6 months.   Procedures:  No procedures performed Allergies: Patient has no known allergies.   Assessment / Plan:     Visit Diagnoses: Rheumatoid arthritis involving multiple sites with positive rheumatoid factor (HCC) - Positive RF, positive ANA: She has no synovitis on examination today.  She has not had any signs or symptoms of a rheumatoid arthritis flare.  She has clinically been doing well taking Plaquenil 200 mg 1 tablet by mouth twice daily.  She is tolerating Plaquenil without any side effects and has not missed any doses recently.  She experiences intermittent stiffness and popping in the left knee but has not had any joint swelling.  On examination today she has good range of motion of the left knee with no warmth or effusion.  She takes Tylenol as needed for pain relief.   She will remain on Plaquenil as prescribed.  A refill was sent to the pharmacy today.   She will notify us if she develops increased joint pain or joint swelling.  She will follow-up in the office in 5 months or sooner if needed.- Plan: hydroxychloroquine (PLAQUENIL) 200 MG tablet  High risk medication use - Plaquenil 200 mg 1 tablet by mouth twice daily.  CBC and CMP updated on 07/25/21. Order for CBC and CMP released today.  She will continue to require lab work every 5 months to monitor for drug toxicity. PLQ eye exam: 11/01/2021 [facility].  She has not had any recent infections.  She had the flu shot on Friday. No new medical conditions. - Plan: CBC with Differential/Platelet, COMPLETE METABOLIC PANEL WITH GFR  Primary osteoarthritis of both hands: DIP prominence noted.  Tenderness of the right index DIP joint noted on examination today.  Complete fist formation bilaterally.  Discussed the importance of joint protection and muscle strengthening.  Chronic pain of left knee - The x-ray showed mild chondromalacia patella.  She experiences intermittent stiffness and popping in the left knee.  On examination today she has good range of motion with no discomfort.  No warmth or effusion noted.  Primary osteoarthritis of right knee - X-ray showed mild osteoarthritis and mild chondromalacia patella.  Good range of motion of the right knee joint.  No warmth or effusion noted.  Anticardiolipin antibody positive - She initially had positive anticardiolipin and beta-2 antibodies.  Repeat antibody testing was negative in 2018 and 09/20/2020.  She remains on Eliquis.  Other medical conditions are listed as follows:  Elevated beta-2  - Negative in 2018 and on 09/20/2020.  Bilateral pulmonary embolism (HCC) - She is on Eliquis.  She is followed by hematology.  Obstructive sleep apnea syndrome  History of depression    Orders: Orders Placed This Encounter  Procedures   CBC with Differential/Platelet   COMPLETE METABOLIC PANEL WITH GFR   Meds ordered this encounter   Medications   hydroxychloroquine (PLAQUENIL) 200 MG tablet    Sig: Take 1 tablet (200 mg total) by mouth 2 (two) times daily.    Dispense:  180 tablet    Refill:  0  Follow-Up Instructions: Return for Rheumatoid arthritis, Osteoarthritis.   Ofilia Neas, PA-C  Note - This record has been created using Dragon software.  Chart creation errors have been sought, but may not always  have been located. Such creation errors do not reflect on  the standard of medical care.

## 2021-12-27 ENCOUNTER — Ambulatory Visit: Payer: Managed Care, Other (non HMO) | Attending: Physician Assistant | Admitting: Physician Assistant

## 2021-12-27 ENCOUNTER — Encounter: Payer: Self-pay | Admitting: Physician Assistant

## 2021-12-27 VITALS — BP 124/77 | HR 78 | Resp 18 | Ht 70.0 in | Wt 382.4 lb

## 2021-12-27 DIAGNOSIS — Z79899 Other long term (current) drug therapy: Secondary | ICD-10-CM | POA: Diagnosis not present

## 2021-12-27 DIAGNOSIS — Z8659 Personal history of other mental and behavioral disorders: Secondary | ICD-10-CM

## 2021-12-27 DIAGNOSIS — M1711 Unilateral primary osteoarthritis, right knee: Secondary | ICD-10-CM

## 2021-12-27 DIAGNOSIS — R0602 Shortness of breath: Secondary | ICD-10-CM

## 2021-12-27 DIAGNOSIS — M25562 Pain in left knee: Secondary | ICD-10-CM

## 2021-12-27 DIAGNOSIS — R7989 Other specified abnormal findings of blood chemistry: Secondary | ICD-10-CM

## 2021-12-27 DIAGNOSIS — R760 Raised antibody titer: Secondary | ICD-10-CM

## 2021-12-27 DIAGNOSIS — G8929 Other chronic pain: Secondary | ICD-10-CM

## 2021-12-27 DIAGNOSIS — M0579 Rheumatoid arthritis with rheumatoid factor of multiple sites without organ or systems involvement: Secondary | ICD-10-CM | POA: Diagnosis not present

## 2021-12-27 DIAGNOSIS — G4733 Obstructive sleep apnea (adult) (pediatric): Secondary | ICD-10-CM

## 2021-12-27 DIAGNOSIS — M19041 Primary osteoarthritis, right hand: Secondary | ICD-10-CM

## 2021-12-27 DIAGNOSIS — M19042 Primary osteoarthritis, left hand: Secondary | ICD-10-CM

## 2021-12-27 DIAGNOSIS — I2699 Other pulmonary embolism without acute cor pulmonale: Secondary | ICD-10-CM

## 2021-12-27 MED ORDER — HYDROXYCHLOROQUINE SULFATE 200 MG PO TABS: 200.0000 mg | ORAL_TABLET | Freq: Two times a day (BID) | ORAL | 0 refills | Status: DC

## 2021-12-27 NOTE — Progress Notes (Signed)
CBC WNL

## 2021-12-28 LAB — COMPLETE METABOLIC PANEL WITH GFR
AG Ratio: 1.3 (calc) (ref 1.0–2.5)
ALT: 21 U/L (ref 6–29)
AST: 20 U/L (ref 10–35)
Albumin: 3.9 g/dL (ref 3.6–5.1)
Alkaline phosphatase (APISO): 67 U/L (ref 31–125)
BUN: 12 mg/dL (ref 7–25)
CO2: 27 mmol/L (ref 20–32)
Calcium: 8.6 mg/dL (ref 8.6–10.2)
Chloride: 105 mmol/L (ref 98–110)
Creat: 0.84 mg/dL (ref 0.50–0.99)
Globulin: 2.9 g/dL (calc) (ref 1.9–3.7)
Glucose, Bld: 128 mg/dL — ABNORMAL HIGH (ref 65–99)
Potassium: 4.5 mmol/L (ref 3.5–5.3)
Sodium: 139 mmol/L (ref 135–146)
Total Bilirubin: 0.5 mg/dL (ref 0.2–1.2)
Total Protein: 6.8 g/dL (ref 6.1–8.1)
eGFR: 87 mL/min/{1.73_m2} (ref >=60)

## 2021-12-28 LAB — CBC WITH DIFFERENTIAL/PLATELET
Absolute Monocytes: 351 cells/uL (ref 200–950)
Basophils Absolute: 39 cells/uL (ref 0–200)
Basophils Relative: 0.6 %
Eosinophils Absolute: 163 cells/uL (ref 15–500)
Eosinophils Relative: 2.5 %
HCT: 38.1 % (ref 35.0–45.0)
Hemoglobin: 12.5 g/dL (ref 11.7–15.5)
Lymphs Abs: 1482 cells/uL (ref 850–3900)
MCH: 26.4 pg — ABNORMAL LOW (ref 27.0–33.0)
MCHC: 32.8 g/dL (ref 32.0–36.0)
MCV: 80.5 fL (ref 80.0–100.0)
MPV: 8.9 fL (ref 7.5–12.5)
Monocytes Relative: 5.4 %
Neutro Abs: 4466 cells/uL (ref 1500–7800)
Neutrophils Relative %: 68.7 %
Platelets: 323 10*3/uL (ref 140–400)
RBC: 4.73 10*6/uL (ref 3.80–5.10)
RDW: 13.8 % (ref 11.0–15.0)
Total Lymphocyte: 22.8 %
WBC: 6.5 10*3/uL (ref 3.8–10.8)

## 2021-12-28 NOTE — Progress Notes (Signed)
Glucose is 128. Rest of CMP WNL

## 2022-04-13 ENCOUNTER — Other Ambulatory Visit: Payer: Self-pay | Admitting: Physician Assistant

## 2022-04-13 DIAGNOSIS — M0579 Rheumatoid arthritis with rheumatoid factor of multiple sites without organ or systems involvement: Secondary | ICD-10-CM

## 2022-04-13 NOTE — Telephone Encounter (Signed)
Next Visit: 05/31/2022  Last Visit: 12/27/2021  Labs: 12/27/2021 Glucose is 128. Rest of CMP WNL   Eye exam: 11/01/2021 [facility]    Current Dose per office note 12/27/2021: Plaquenil 200 mg 1 tablet by mouth twice daily.    DX: Rheumatoid arthritis involving multiple sites with positive rheumatoid factor   Last Fill: 12/27/2021  Okay to refill Plaquenil?

## 2022-05-31 ENCOUNTER — Ambulatory Visit: Payer: Managed Care, Other (non HMO) | Admitting: Physician Assistant

## 2022-06-06 NOTE — Progress Notes (Unsigned)
Office Visit Note  Patient: Renee Howard             Date of Birth: 19-Jun-1975           MRN: [id number]             PCP: Charisse MarchKiker, Cameron S, NP-C Referring: Charisse MarchKiker, Cameron S, NP-C Visit Date: 06/20/2022 Occupation: @GUAROCC @  Subjective:  Lower back pain   History of Present Illness: Renee Howard is a 47 y.o. female with history of seropositive rheumatoid arthritis and osteoarthritis.  Patient remains on Plaquenil 200 mg 1 tablet by mouth twice daily.  She continues to tolerate plaquenil without any side effects. Patient states she has been experiencing more consistent lower back pain.  She states her symptoms are alleviated by taking medrol dosepak.   She states she previously had x-rays performed by her PCP but no further imaging recently.  She denies any symptoms of radiculopathy.   She states she has been having more frequent infections on a monthly basis around the time of her menstrual cycle.  She has been evaluated by her gynecologist who felt it was due to her underlying RA.    Activities of Daily Living:  Patient reports morning stiffness for 1 hour.   Patient Reports nocturnal pain.  Difficulty dressing/grooming: Reports Difficulty climbing stairs: Reports Difficulty getting out of chair: Reports Difficulty using hands for taps, buttons, cutlery, and/or writing: Denies  Review of Systems  Constitutional:  Positive for fatigue.  HENT:  Negative for mouth sores and mouth dryness.   Eyes:  Negative for dryness.  Respiratory:  Negative for shortness of breath.   Cardiovascular:  Negative for chest pain and palpitations.  Gastrointestinal:  Negative for blood in stool, constipation and diarrhea.  Endocrine: Negative for increased urination.  Genitourinary:  Negative for involuntary urination.  Musculoskeletal:  Positive for joint pain, joint pain, joint swelling, myalgias, muscle weakness, morning stiffness, muscle tenderness and myalgias. Negative for gait  problem.  Skin:  Positive for hair loss and sensitivity to sunlight. Negative for color change and rash.  Allergic/Immunologic: Positive for susceptible to infections.  Neurological:  Negative for dizziness and headaches.  Hematological:  Negative for swollen glands.  Psychiatric/Behavioral:  Positive for depressed mood and sleep disturbance. The patient is not nervous/anxious.     PMFS History:  Patient Active Problem List   Diagnosis Date Noted   Primary osteoarthritis of both hands 12/26/2016   High risk medication use 06/28/2016   Anticardiolipin antibody positive 06/18/2016   Elevated beta-2  06/18/2016   Numbness 09/03/2014   Urinary frequency 09/03/2014   Gait disturbance 09/03/2014   Arm pain, left 09/03/2014   Carpal tunnel syndrome 09/03/2014   Cervical radiculitis 09/03/2014   Rheumatoid arthritis involving multiple sites with positive rheumatoid factor 09/03/2014    Past Medical History:  Diagnosis Date   Anticardiolipin antibody positive 06/18/2016   Anxiety    Arm pain, left 09/03/2014   Arthritis    Carpal tunnel syndrome 09/03/2014   Cervical radiculitis 09/03/2014   Depression    DVT (deep venous thrombosis)    Dyspnea on exertion    Elevated beta-2  06/18/2016   Gait disturbance 09/03/2014   Low back pain    No pertinent past medical history    Numbness 09/03/2014   PONV (postoperative nausea and vomiting)    Primary osteoarthritis of both hands 12/26/2016   Pulmonary embolism 11/18/2020   per patient   Rheumatoid arthritis involving multiple sites with positive rheumatoid  factor 09/03/2014   Urinary frequency 09/03/2014    Family History  Problem Relation Age of Onset   Gallbladder disease Mother    Gallstones Father    Hypertension Father    Asperger's syndrome Brother    Cancer - Ovarian Paternal Grandmother    Healthy Son    Past Surgical History:  Procedure Laterality Date   CESAREAN SECTION     2009   HERNIA REPAIR     1978    Social History   Social History Narrative   Not on file   Immunization History  Administered Date(s) Administered   Moderna Sars-Covid-2 Vaccination 05/23/2019, 06/23/2019   PFIZER(Purple Top)SARS-COV-2 Vaccination 01/04/2020     Objective: Vital Signs: BP (!) 157/77 (BP Location: Left Arm, Patient Position: Sitting, Cuff Size: Normal)   Pulse 79   Wt (!) 381 lb (172.8 kg)   BMI 54.67 kg/m    Physical Exam Vitals and nursing note reviewed.  Constitutional:      Appearance: She is well-developed.  HENT:     Head: Normocephalic and atraumatic.  Eyes:     Conjunctiva/sclera: Conjunctivae normal.  Cardiovascular:     Rate and Rhythm: Normal rate and regular rhythm.     Heart sounds: Normal heart sounds.  Pulmonary:     Effort: Pulmonary effort is normal.     Breath sounds: Normal breath sounds.  Abdominal:     General: Bowel sounds are normal.     Palpations: Abdomen is soft.  Musculoskeletal:     Cervical back: Normal range of motion.  Lymphadenopathy:     Cervical: No cervical adenopathy.  Skin:    General: Skin is warm and dry.     Capillary Refill: Capillary refill takes less than 2 seconds.  Neurological:     Mental Status: She is alert and oriented to person, place, and time.  Psychiatric:        Behavior: Behavior normal.      Musculoskeletal Exam: C-spine has good range of motion.  Painful range of motion of the lumbar spine.  Midline spinal tenderness in the lumbar region.  Some tenderness over the left SI joint.  Shoulder joints, elbow joints, wrist joints, MCPs, PIPs, DIPs have good range of motion with no synovitis.  Hip joints have good range of motion with no groin pain.  Knee joints have good range of motion with no warmth or effusion.  Ankle joints have good range of motion with no tenderness or joint swelling.  CDAI Exam: CDAI Score: -- Patient Global: 2 mm; Provider Global: 2 mm Swollen: --; Tender: -- Joint Exam 06/20/2022   No joint exam  has been documented for this visit   There is currently no information documented on the homunculus. Go to the Rheumatology activity and complete the homunculus joint exam.  Investigation: No additional findings.  Imaging: No results found.  Recent Labs: Lab Results  Component Value Date   WBC 6.5 12/27/2021   HGB 12.5 12/27/2021   PLT 323 12/27/2021   NA 139 12/27/2021   K 4.5 12/27/2021   CL 105 12/27/2021   CO2 27 12/27/2021   GLUCOSE 128 (H) 12/27/2021   BUN 12 12/27/2021   CREATININE 0.84 12/27/2021   BILITOT 0.5 12/27/2021   ALKPHOS 70 10/06/2013   AST 20 12/27/2021   ALT 21 12/27/2021   PROT 6.8 12/27/2021   ALBUMIN 3.9 10/06/2013   CALCIUM 8.6 12/27/2021   GFRAA 94 04/21/2020    Speciality Comments: PLQ eye exam: 11/01/2021  [facility]. Follow up in 6 months.   Procedures:  No procedures performed Allergies: Patient has no known allergies.   Assessment / Plan:     Visit Diagnoses: Rheumatoid arthritis involving multiple sites with positive rheumatoid factor - Positive RF, positive ANA: She has no synovitis on examination today.  She has not had any signs or symptoms of a rheumatoid arthritis flare.  She has clinically been doing well taking Plaquenil 200 mg 1 tablet by mouth twice daily.  She is tolerating Plaquenil without any side effects and has not missed any doses recently.  She is not experiencing increased discomfort in her lower back but was reassured that this is unrelated to her rheumatoid arthritis.  Patient declined to have updated x-rays of the lumbar spine today so a referral to Dr. Christell Constant was placed for further evaluation and management. She will remain on Plaquenil as prescribed for management of rheumatoid arthritis.  She was advised to notify us if she develops signs or symptoms of a flare.  She will follow-up in the office in 5 months or sooner if needed.  High risk medication use - Plaquenil 200 mg 1 tablet by mouth twice daily. CBC  and CMP updated on 05/16/22.   PLQ eye exam: 11/01/2021 [facility].  Primary osteoarthritis of both hands: She has PIP and DIP thickening consistent with osteoarthritis of both hands.  No inflammation was noted on examination today.  She is able to make a complete fist bilaterally.  She has been experiencing intermittent discomfort in her hands typically around her menstrual cycle.  Chronic pain of left knee: She has good range of motion of the left knee joint on examination today.  No warmth or effusion was noted.  Primary osteoarthritis of right knee - X-ray showed mild osteoarthritis and mild chondromalacia patella.  Good range of motion of the right knee with no warmth or effusion.  Chronic midline low back pain without sciatica : Patient has been experiencing persistent discomfort in her lower back.  Patient reports that she previously had intermittent discomfort which is typically exacerbated by her level of activity but recently the pain has been more consistent.  She previously had x-rays ordered by her PCP but has not had any recent imaging.  She is not currently experiencing any symptoms of radiculopathy.  Most of her discomfort is in the lumbar region.  On examination she has some midline spinal tenderness in the lumbar region as well as some tenderness over the left SI joint.  Her lower back pain has been responsive to Medrol Dosepak's in the past.  Offered an x-ray of the lumbar spine today but she declined.  A referral to Dr. Christell Constant will be placed for further evaluation and management.  Anticardiolipin antibody positive - initially had positive anticardiolipin and beta-2 antibodies.  Repeat antibody testing was negative in 2018 and 09/20/2020. She remains on Eliquis.  Elevated beta-2  - Negative in 2018 and on 09/20/2020.  Other medical conditions are listed as follows:  Bilateral pulmonary embolism: Remains on Eliquis as prescribed.  Obstructive sleep apnea  syndrome  History of depression   Orders: Orders Placed This Encounter  Procedures   Ambulatory referral to Orthopedic Surgery   No orders of the defined types were placed in this encounter.    Follow-Up Instructions: Return in about 5 months (around 11/20/2022) for Rheumatoid arthritis, Osteoarthritis.   Gearldine Bienenstock, PA-C  Note - This record has been created using Dragon software.  Chart creation errors have been sought, but may not always  have been located. Such creation errors do not reflect on  the standard of medical care.

## 2022-06-20 ENCOUNTER — Ambulatory Visit: Payer: Managed Care, Other (non HMO) | Attending: Physician Assistant | Admitting: Physician Assistant

## 2022-06-20 ENCOUNTER — Encounter: Payer: Self-pay | Admitting: Physician Assistant

## 2022-06-20 VITALS — BP 157/77 | HR 79 | Wt 381.0 lb

## 2022-06-20 DIAGNOSIS — R760 Raised antibody titer: Secondary | ICD-10-CM

## 2022-06-20 DIAGNOSIS — M0579 Rheumatoid arthritis with rheumatoid factor of multiple sites without organ or systems involvement: Secondary | ICD-10-CM | POA: Diagnosis not present

## 2022-06-20 DIAGNOSIS — R7989 Other specified abnormal findings of blood chemistry: Secondary | ICD-10-CM

## 2022-06-20 DIAGNOSIS — G4733 Obstructive sleep apnea (adult) (pediatric): Secondary | ICD-10-CM

## 2022-06-20 DIAGNOSIS — Z79899 Other long term (current) drug therapy: Secondary | ICD-10-CM

## 2022-06-20 DIAGNOSIS — M25562 Pain in left knee: Secondary | ICD-10-CM

## 2022-06-20 DIAGNOSIS — M19041 Primary osteoarthritis, right hand: Secondary | ICD-10-CM

## 2022-06-20 DIAGNOSIS — M1711 Unilateral primary osteoarthritis, right knee: Secondary | ICD-10-CM

## 2022-06-20 DIAGNOSIS — I2699 Other pulmonary embolism without acute cor pulmonale: Secondary | ICD-10-CM

## 2022-06-20 DIAGNOSIS — G8929 Other chronic pain: Secondary | ICD-10-CM

## 2022-06-20 DIAGNOSIS — Z8659 Personal history of other mental and behavioral disorders: Secondary | ICD-10-CM

## 2022-06-20 DIAGNOSIS — M545 Low back pain, unspecified: Secondary | ICD-10-CM

## 2022-06-20 DIAGNOSIS — M19042 Primary osteoarthritis, left hand: Secondary | ICD-10-CM

## 2022-06-29 ENCOUNTER — Ambulatory Visit: Payer: Managed Care, Other (non HMO) | Admitting: Orthopedic Surgery

## 2022-07-11 ENCOUNTER — Ambulatory Visit: Payer: Managed Care, Other (non HMO) | Admitting: Orthopedic Surgery

## 2022-07-11 ENCOUNTER — Other Ambulatory Visit (INDEPENDENT_AMBULATORY_CARE_PROVIDER_SITE_OTHER): Payer: Managed Care, Other (non HMO)

## 2022-07-11 ENCOUNTER — Encounter: Payer: Self-pay | Admitting: Orthopedic Surgery

## 2022-07-11 VITALS — BP 131/83 | HR 85 | Ht 70.5 in | Wt 381.0 lb

## 2022-07-11 DIAGNOSIS — M545 Low back pain, unspecified: Secondary | ICD-10-CM

## 2022-07-11 NOTE — Progress Notes (Addendum)
Orthopedic Spine Surgery Office Note  Assessment: Patient is a 47 y.o. female with chronic progressive low back pain with acute exacerbations.  Has disc at loss at L5/S1   Plan: -Patient has tried PT, NSAIDs, tylenol, medrol dosepak, intramuscular steroid injections  -Recommended core strengthening, weight loss, periodic medrol dosepaks -Would need to get down to a BMI of 40 before any elective spine surgery -Patient should return to office on an as needed basis   Patient expressed understanding of the plan and all questions were answered to the patient's satisfaction.   ___________________________________________________________________________   History:  Patient is a 47 y.o. female who presents today for lumbar spine. Patient has had low back pain with flares of pain for the last 5 years. There was no trauma or injury that preceded the onset of pain. She says that she has chronic low back pain that she experiences on a daily basis. She reports that this pain is tolerable.  She gets flares of low back pain that are severe though.  These flares have been occurring more frequently as time has gone on.  When she has the spells, it is difficult to do her daily activities.  She even calls out of work at times because of the pain.  When the pain is really severe, she uses a cane to help her ambulate.  Has found that Medrol Dosepak and intramuscular steroid injections are helpful to get her out of the flare of pain.  Afterwards, she returns to her normal baseline chronic back pain.  Her pain is worse in the left paraspinal region and not as significant on the right paraspinal area.  She does not have any pain radiating down either lower extremity.   Weakness: denies Symptoms of imbalance: denies Paresthesias and numbness: denies Bowel or bladder incontinence: denies Saddle anesthesia: denies  Treatments tried: PT, NSAIDs, tylenol, medrol dosepak, intramuscular steroid injections  Review of  systems: Denies fevers, unexplained weight loss, history of cancer. Has had pain that wakes her at night. Also, reporting night sweats and chills that she attributes to being pre-menopausal  Past medical history: HLD History of DVT and PE Depression OSA Chronic pain RA  Allergies: NKDA  Past surgical history:  Hernia repair Cesarean section  Social history: Denies use of nicotine product (smoking, vaping, patches, smokeless) Alcohol use: denies Denies recreational drug use   Physical Exam:  BMI of 53.9  General: no acute distress, appears stated age Neurologic: alert, answering questions appropriately, following commands Respiratory: unlabored breathing on room air, symmetric chest rise Psychiatric: appropriate affect, normal cadence to speech   MSK (spine):  -Strength exam      Left  Right EHL    5/5  5/5 TA    5/5  5/5 GSC    5/5  5/5 Knee extension  5/5  5/5 Hip flexion   5/5  5/5  -Sensory exam    Sensation intact to light touch in L3-S1 nerve distributions of bilateral lower extremities  -Achilles DTR: 2/4 on the left, 2/4 on the right -Patellar tendon DTR: 1/4 on the left, 1/4 on the right  -Straight leg raise: negative bilaterally -Femoral nerve stretch test: negative bilaterally -Clonus: no beats bilaterally  -Left hip exam: no pain through range of motion, negative stinchfield, negative faber, negative si joint compression test, TTP over the SI joint, no other tenderness to palpation -Right hip exam: no pain through range of motion, negative stinchfield, negative faber, negative si joint compression test, no tenderness to palpation  Imaging: XR of the lumbar spine from 07/11/2022 was independently reviewed and interpreted, showing disc height loss at L5/S1.  No other significant degenerative changes.  No fracture or dislocation seen.  No evidence of instability on flexion/extension views.   Patient name: Renee Howard Patient MRN:  [id number] Date of visit: 07/11/22

## 2022-07-16 ENCOUNTER — Other Ambulatory Visit: Payer: Self-pay | Admitting: Physician Assistant

## 2022-07-16 DIAGNOSIS — M0579 Rheumatoid arthritis with rheumatoid factor of multiple sites without organ or systems involvement: Secondary | ICD-10-CM

## 2022-07-16 NOTE — Telephone Encounter (Signed)
Last Fill: 04/13/2022  Eye exam: 11/01/2021 [facility]    Labs: 05/16/2022 Hgb 12.1, MCV 77.0, MCH 25.8  Next Visit: 11/20/2022  Last Visit: 06/20/2022  DX: Rheumatoid arthritis involving multiple sites with positive rheumatoid factor   Current Dose per office note 06/20/2022: Plaquenil 200 mg 1 tablet by mouth twice daily.   Okay to refill Plaquenil?

## 2022-10-11 ENCOUNTER — Ambulatory Visit: Payer: Managed Care, Other (non HMO) | Admitting: Orthopedic Surgery

## 2022-10-18 ENCOUNTER — Other Ambulatory Visit (INDEPENDENT_AMBULATORY_CARE_PROVIDER_SITE_OTHER): Payer: Managed Care, Other (non HMO)

## 2022-10-18 ENCOUNTER — Ambulatory Visit: Payer: Managed Care, Other (non HMO) | Admitting: Orthopedic Surgery

## 2022-10-18 DIAGNOSIS — M25561 Pain in right knee: Secondary | ICD-10-CM

## 2022-10-18 NOTE — Progress Notes (Signed)
Orthopedic Surgery Progress Note   Assessment: Patient is a 47 y.o. female with right knee pain, suspect meniscus injury   Plan: -No acute operative intervention -Weight bearing status: as tolerated -Continue with the strengthening exercises as previously recommended -Continue with tylenol 1000mg  TID for pain control -If pain does not get better, will order MRI of the right knee to evaluate for meniscus tear -She will call the office if she is still having pain at which point, I will order the MRI   ___________________________________________________________________________  Subjective: Patient injured her knee in the pool in early June of this year.  She noted acute onset of significant pain over the medial aspect the knee.  She was seen by a nurse practitioner after this injury who recommended Tylenol and a strengthening program for the quads and hamstrings.  She notes that her knee pain has been getting better since using the Tylenol and doing the exercises.  She is still having pain over the medial aspect of the knee.  She says she can only walk about 10 minutes before pain starts and requires her to essentially rest the rest of the day.  She likes to travel and is concerned about her ability to this in the future since she can only walk about 10 minutes now.   Physical Exam:  General: no acute distress, appears stated age Neurologic: alert, answering questions appropriately, following commands Respiratory: unlabored breathing on room air, symmetric chest rise Psychiatric: appropriate affect, normal cadence to speech  MSK:   -Right lower extremity  TTP over the medial joint line and the pes bursa, no other tenderness palpation over the extremity  No palpable effusion  Knee range of motion from 0-120 EHL/TA/GSC intact, plantar flexes and dorsiflexes toes Knee stable to varus and valgus stress, negative Lachman, negative posterior drawer Pain with McMurray but no palpable click,  pain with thessaly test Sensation intact to light touch in sural, saphenous, tibial, deep peroneal, and superficial peroneal nerve distributions Foot warm and well perfused  Imaging: XR of the right knee taken and reviewed today show small osteophyte formation in the patellofemoral joint. No other significant degenerative changes. No fracture or dislocation seen.    Patient name: Renee Howard Patient MRN: [id number] Date: 10/18/22

## 2022-10-25 ENCOUNTER — Telehealth: Payer: Self-pay | Admitting: Orthopedic Surgery

## 2022-10-25 DIAGNOSIS — M23321 Other meniscus derangements, posterior horn of medial meniscus, right knee: Secondary | ICD-10-CM

## 2022-10-25 NOTE — Telephone Encounter (Signed)
Patient called. Would like a MRI ordered on her knee.

## 2022-10-25 NOTE — Telephone Encounter (Signed)
Orthopedic Telephone Note  Patient has been doing over 6 weeks of conservative treatment for her knee pain.  At her last visit, I suspected a meniscus tear and told her that if her pain continues then we would get an MRI.  Her pain has persisted and she is unable to walk more than 10 minutes before having significant pain.  She is unable to do any activities that involve any kind of standing or walking for prolonged period of time because of the pain.  Accordingly, recommended MRI of the right knee to evaluate for meniscus tear.  This was ordered today. I will call her with the result to go over next steps in treatment.   London Sheer, MD Orthopedic Surgeon

## 2022-10-29 ENCOUNTER — Encounter: Payer: Self-pay | Admitting: Orthopedic Surgery

## 2022-11-06 NOTE — Progress Notes (Unsigned)
Office Visit Note  Patient: Renee Howard             Date of Birth: [birth year]           MRN: [id number]             PCP: Charisse March, NP-C Referring: Charisse March, NP-C Visit Date: 11/20/2022 Occupation: @GUAROCC @  Subjective:  Facial rash   History of Present Illness: Renee Howard is a 47 y.o. female with history of seropositive rheumatoid arthritis and osteoarthritis.  Patient remains on plaquenil 200 mg 1 tablet by mouth twice daily.  She is tolerating Plaquenil without any side effects and has not missed any doses recently.  Patient reports that she updated her Plaquenil eye examination yesterday and has asked for records to be forwarded to our office.  She denies missing any doses of Plaquenil recently.  Patient states she recently had an episode of pain and warmth in her bilateral index fingers which was self resolving.  She denies an increased frequency of flares.  Patient states for the past 2 weeks she has been experiencing increased muscle spasms in her lower back.  Patient was evaluated by Dr. Christell Constant after her last office visit but has continued to have intermittent discomfort.  No symptoms of radiculopathy at this time. Patient states that for the past 3 years she has noticed a facial rash off and on.  She states that at times the rash worsens with certain triggers.  She has not been evaluated by her dermatologist recently.  She is concerned about possible lupus.  Activities of Daily Living:  Patient reports morning stiffness for 30 minutes.   Patient Reports nocturnal pain.  Difficulty dressing/grooming: Reports Difficulty climbing stairs: Reports Difficulty getting out of chair: Reports Difficulty using hands for taps, buttons, cutlery, and/or writing: Reports  Review of Systems  Constitutional:  Positive for fatigue.  HENT:  Negative for mouth sores, mouth dryness and nose dryness.   Eyes:  Positive for dryness. Negative for pain and visual  disturbance.  Respiratory:  Negative for cough, hemoptysis, shortness of breath and difficulty breathing.   Cardiovascular:  Negative for chest pain, palpitations, hypertension and swelling in legs/feet.  Gastrointestinal:  Negative for blood in stool, constipation and diarrhea.  Endocrine: Positive for increased urination.  Genitourinary:  Positive for involuntary urination. Negative for painful urination.  Musculoskeletal:  Positive for joint pain, gait problem, joint pain, myalgias, muscle weakness, morning stiffness and myalgias. Negative for joint swelling and muscle tenderness.  Skin:  Positive for color change, hair loss and sensitivity to sunlight. Negative for pallor, rash, nodules/bumps, skin tightness and ulcers.  Allergic/Immunologic: Positive for susceptible to infections.  Neurological:  Positive for dizziness and headaches. Negative for numbness and weakness.  Hematological:  Negative for swollen glands.  Psychiatric/Behavioral:  Positive for depressed mood and sleep disturbance. The patient is nervous/anxious.     PMFS History:  Patient Active Problem List   Diagnosis Date Noted   Primary osteoarthritis of both hands 12/26/2016   High risk medication use 06/28/2016   Anticardiolipin antibody positive 06/18/2016   Elevated beta-2  06/18/2016   Numbness 09/03/2014   Urinary frequency 09/03/2014   Gait disturbance 09/03/2014   Arm pain, left 09/03/2014   Carpal tunnel syndrome 09/03/2014   Cervical radiculitis 09/03/2014   Rheumatoid arthritis involving multiple sites with positive rheumatoid factor (HCC) 09/03/2014    Past Medical History:  Diagnosis Date   Anticardiolipin antibody positive 06/18/2016   Anxiety  Arm pain, left 09/03/2014   Arthritis    Carpal tunnel syndrome 09/03/2014   Cervical radiculitis 09/03/2014   Depression    DVT (deep venous thrombosis) (HCC)    Dyspnea on exertion    Elevated beta-2  06/18/2016   Gait disturbance 09/03/2014   Low  back pain    No pertinent past medical history    Numbness 09/03/2014   PONV (postoperative nausea and vomiting)    Primary osteoarthritis of both hands 12/26/2016   Pulmonary embolism (HCC) 11/18/2020   per patient   Rheumatoid arthritis involving multiple sites with positive rheumatoid factor (HCC) 09/03/2014   Urinary frequency 09/03/2014    Family History  Problem Relation Age of Onset   Gallbladder disease Mother    Gallstones Father    Hypertension Father    Asperger's syndrome Brother    Cancer - Ovarian Paternal Grandmother    Healthy Son    Past Surgical History:  Procedure Laterality Date   CESAREAN SECTION     2009   HERNIA REPAIR     1978   Social History   Social History Narrative   Not on file   Immunization History  Administered Date(s) Administered   Moderna Sars-Covid-2 Vaccination 05/23/2019, 06/23/2019   PFIZER(Purple Top)SARS-COV-2 Vaccination 01/04/2020     Objective: Vital Signs: BP 125/76 (BP Location: Left Arm, Patient Position: Sitting, Cuff Size: Large)   Pulse 90   Resp 17   Ht 5\' 10"  (1.778 m)   Wt (!) 391 lb (177.4 kg)   BMI 56.10 kg/m    Physical Exam Vitals and nursing note reviewed.  Constitutional:      Appearance: She is well-developed.  HENT:     Head: Normocephalic and atraumatic.  Eyes:     Conjunctiva/sclera: Conjunctivae normal.  Cardiovascular:     Rate and Rhythm: Normal rate and regular rhythm.     Heart sounds: Normal heart sounds.  Pulmonary:     Effort: Pulmonary effort is normal.     Breath sounds: Normal breath sounds.  Abdominal:     General: Bowel sounds are normal.     Palpations: Abdomen is soft.  Musculoskeletal:     Cervical back: Normal range of motion.  Lymphadenopathy:     Cervical: No cervical adenopathy.  Skin:    General: Skin is warm and dry.     Capillary Refill: Capillary refill takes less than 2 seconds.  Neurological:     Mental Status: She is alert and oriented to person, place, and  time.  Psychiatric:        Behavior: Behavior normal.      Musculoskeletal Exam: Patient remained seated during the examination today.  C-spine has good range of motion.  Muscle spasms in the lower back.  Shoulder joints, elbow joints, wrist joints, MCPs, PIPs, DIPs have good range of motion with no synovitis.  Complete fist formation bilaterally.  Mild prominence of PIP and DIP joints.  Hip joints have good assistance of the physician.  Knee joints have good range of motion with no warmth or effusion.  Ankle joints have good range of motion with no joint tenderness.  No tenderness or synovitis over MTP joints.  CDAI Exam: CDAI Score: -- Patient Global: 10 / 100; Provider Global: 10 / 100 Swollen: --; Tender: -- Joint Exam 11/20/2022   No joint exam has been documented for this visit   There is currently no information documented on the homunculus. Go to the Rheumatology activity and complete the homunculus  joint exam.  Investigation: No additional findings.  Imaging: No results found.  Recent Labs: Lab Results  Component Value Date   WBC 6.5 12/27/2021   HGB 12.5 12/27/2021   PLT 323 12/27/2021   NA 139 12/27/2021   K 4.5 12/27/2021   CL 105 12/27/2021   CO2 27 12/27/2021   GLUCOSE 128 (H) 12/27/2021   BUN 12 12/27/2021   CREATININE 0.84 12/27/2021   BILITOT 0.5 12/27/2021   ALKPHOS 70 10/06/2013   AST 20 12/27/2021   ALT 21 12/27/2021   PROT 6.8 12/27/2021   ALBUMIN 3.9 10/06/2013   CALCIUM 8.6 12/27/2021   GFRAA 94 04/21/2020    Speciality Comments: PLQ eye exam: 11/01/2021 W[facility]. Follow up in 6 months. Patient states she had eye exam 11/19/2022   Procedures:  No procedures performed Allergies: Patient has no known allergies.   Assessment / Plan:     Visit Diagnoses: Rheumatoid arthritis involving multiple sites with positive rheumatoid factor (HCC) - Positive RF, positive ANA: She has no joint tenderness or synovitis on examination today.   She recently had an episode of increased pain and warmth involving bilateral index fingers which was self resolving.  She has not noticed an increased frequency of flares since her last office visit.  She remains on Plaquenil 200 mg 1 tablet by mouth twice daily.  She continues to tolerate Plaquenil without any side effects.  She had an updated Plaquenil eye examination performed yesterday which was normal and has advised results to be forwarded to our office to review.  She will remain on Plaquenil as monotherapy.  She was vies notify us if she develops signs or symptoms of a flare.  She will follow-up in the office in 5 months or sooner if needed.  High risk medication use - Plaquenil 200 mg 1 tablet by mouth twice daily. - PLQ eye exam: 11/01/2021 [facility].  Patient had an updated Plaquenil examination yesterday on 11/19/2022 and has advised records to be forwarded to our office to review. CBC and CMP updated on 05/16/22. Orders for CBC and CMP released today.   Plan: COMPLETE METABOLIC PANEL WITH GFR, CBC with Differential/Platelet  Primary osteoarthritis of both hands: Mild PIP and DIP prominence consistent with osteoarthritis of both hands.  No synovitis noted on examination today.  Complete fist formation noted bilaterally.  Discussed the importance of joint protection and muscle strengthening.  Chronic pain of left knee: Good range of motion of the left knee joint on examination today.  No warmth or effusion noted.  Primary osteoarthritis of right knee: Good range of motion of the right knee joint on examination today.  No warmth or effusion noted.  Facial rash -facial rash x 3 years.  The rash seems to wax and wane.  Facial erythema was noted on examination today concerning for possible rosacea.  The patient questioned the possible diagnosis of lupus so the following lab work will be obtained today for further evaluation.   I encouraged the patient to follow-up with her  dermatologist as well.  Plan: ANA, C3 and C4, Sedimentation rate, Anti-Smith antibody, RNP Antibody, Anti-DNA antibody, double-stranded  Anticardiolipin antibody positive - initially had positive anticardiolipin and beta-2 antibodies.  Repeat antibody testing was negative in 2018 and 09/20/2020. She remains on Eliquis.  Elevated beta-2: Patient remains on Eliquis.  Bilateral pulmonary embolism (HCC) - Remains on eliquis  Chronic midline low back pain without sciatica: Evaluated by Dr. Christell Constant.  She continues to  have intermittent flareups of her lower back.  She has been experiencing muscle spasms intermittently for the past 2 weeks.  A prescription for methocarbamol 500 mg 1 tablet daily as needed for muscle spasms present to the pharmacy today.  Muscle spasm of back: Presents today with muscle spasms involving her lower back.  She has been having intermittent spasms for the past 2 weeks.  A prescription for methocarbamol 500 mg 1 tablet daily as needed was sent to the pharmacy today.  Other medical conditions are listed as follows:   Obstructive sleep apnea syndrome  History of depression   Orders: Orders Placed This Encounter  Procedures   COMPLETE METABOLIC PANEL WITH GFR   CBC with Differential/Platelet   ANA   C3 and C4   Sedimentation rate   Anti-Smith antibody   RNP Antibody   Anti-DNA antibody, double-stranded   Meds ordered this encounter  Medications   methocarbamol (ROBAXIN) 500 MG tablet    Sig: Take 1 tablet (500 mg total) by mouth daily as needed.    Dispense:  30 tablet    Refill:  0   Follow-Up Instructions: Return in about 5 months (around 04/22/2023) for Rheumatoid arthritis.   Gearldine Bienenstock, PA-C  Note - This record has been created using Dragon software.  Chart creation errors have been sought, but may not always  have been located. Such creation errors do not reflect on  the standard of medical care.

## 2022-11-20 ENCOUNTER — Ambulatory Visit: Payer: Managed Care, Other (non HMO) | Attending: Physician Assistant | Admitting: Physician Assistant

## 2022-11-20 ENCOUNTER — Encounter: Payer: Self-pay | Admitting: Physician Assistant

## 2022-11-20 VITALS — BP 125/76 | HR 90 | Resp 17 | Ht 70.0 in | Wt 391.0 lb

## 2022-11-20 DIAGNOSIS — Z79899 Other long term (current) drug therapy: Secondary | ICD-10-CM

## 2022-11-20 DIAGNOSIS — M19041 Primary osteoarthritis, right hand: Secondary | ICD-10-CM

## 2022-11-20 DIAGNOSIS — M0579 Rheumatoid arthritis with rheumatoid factor of multiple sites without organ or systems involvement: Secondary | ICD-10-CM

## 2022-11-20 DIAGNOSIS — M19042 Primary osteoarthritis, left hand: Secondary | ICD-10-CM

## 2022-11-20 DIAGNOSIS — M25562 Pain in left knee: Secondary | ICD-10-CM | POA: Diagnosis not present

## 2022-11-20 DIAGNOSIS — I2699 Other pulmonary embolism without acute cor pulmonale: Secondary | ICD-10-CM

## 2022-11-20 DIAGNOSIS — M545 Low back pain, unspecified: Secondary | ICD-10-CM

## 2022-11-20 DIAGNOSIS — R7989 Other specified abnormal findings of blood chemistry: Secondary | ICD-10-CM

## 2022-11-20 DIAGNOSIS — Z8659 Personal history of other mental and behavioral disorders: Secondary | ICD-10-CM

## 2022-11-20 DIAGNOSIS — G8929 Other chronic pain: Secondary | ICD-10-CM

## 2022-11-20 DIAGNOSIS — M1711 Unilateral primary osteoarthritis, right knee: Secondary | ICD-10-CM

## 2022-11-20 DIAGNOSIS — R21 Rash and other nonspecific skin eruption: Secondary | ICD-10-CM

## 2022-11-20 DIAGNOSIS — G4733 Obstructive sleep apnea (adult) (pediatric): Secondary | ICD-10-CM

## 2022-11-20 DIAGNOSIS — R760 Raised antibody titer: Secondary | ICD-10-CM

## 2022-11-20 DIAGNOSIS — M62830 Muscle spasm of back: Secondary | ICD-10-CM

## 2022-11-20 MED ORDER — METHOCARBAMOL 500 MG PO TABS: 500.0000 mg | ORAL_TABLET | Freq: Every day | ORAL | 0 refills | Status: DC | PRN

## 2022-11-21 LAB — CBC WITH DIFFERENTIAL/PLATELET
Absolute Monocytes: 459 {cells}/uL (ref 200–950)
Basophils Absolute: 37 {cells}/uL (ref 0–200)
Basophils Relative: 0.3 %
Eosinophils Absolute: 62 {cells}/uL (ref 15–500)
Eosinophils Relative: 0.5 %
HCT: 41 % (ref 35.0–45.0)
Hemoglobin: 13.3 g/dL (ref 11.7–15.5)
Lymphs Abs: 1897 {cells}/uL (ref 850–3900)
MCH: 25.7 pg — ABNORMAL LOW (ref 27.0–33.0)
MCHC: 32.4 g/dL (ref 32.0–36.0)
MCV: 79.3 fL — ABNORMAL LOW (ref 80.0–100.0)
MPV: 9.5 fL (ref 7.5–12.5)
Monocytes Relative: 3.7 %
Neutro Abs: 9945 {cells}/uL — ABNORMAL HIGH (ref 1500–7800)
Neutrophils Relative %: 80.2 %
Platelets: 351 10*3/uL (ref 140–400)
RBC: 5.17 10*6/uL — ABNORMAL HIGH (ref 3.80–5.10)
RDW: 14.6 % (ref 11.0–15.0)
Total Lymphocyte: 15.3 %
WBC: 12.4 10*3/uL — ABNORMAL HIGH (ref 3.8–10.8)

## 2022-11-21 LAB — COMPLETE METABOLIC PANEL WITH GFR
AG Ratio: 1.3 (calc) (ref 1.0–2.5)
ALT: 20 U/L (ref 6–29)
AST: 17 U/L (ref 10–35)
Albumin: 3.9 g/dL (ref 3.6–5.1)
Alkaline phosphatase (APISO): 71 U/L (ref 31–125)
BUN: 21 mg/dL (ref 7–25)
CO2: 25 mmol/L (ref 20–32)
Calcium: 9.1 mg/dL (ref 8.6–10.2)
Chloride: 103 mmol/L (ref 98–110)
Creat: 0.84 mg/dL (ref 0.50–0.99)
Globulin: 3 g/dL (ref 1.9–3.7)
Glucose, Bld: 139 mg/dL — ABNORMAL HIGH (ref 65–99)
Potassium: 4.3 mmol/L (ref 3.5–5.3)
Sodium: 139 mmol/L (ref 135–146)
Total Bilirubin: 0.5 mg/dL (ref 0.2–1.2)
Total Protein: 6.9 g/dL (ref 6.1–8.1)
eGFR: 87 mL/min/{1.73_m2} (ref >=60)

## 2022-11-21 LAB — C3 AND C4
C3 Complement: 202 mg/dL — ABNORMAL HIGH (ref 83–193)
C4 Complement: 19 mg/dL (ref 15–57)

## 2022-11-21 LAB — ANTI-DNA ANTIBODY, DOUBLE-STRANDED: ds DNA Ab: <1 [IU]/mL

## 2022-11-21 LAB — SEDIMENTATION RATE: Sed Rate: 17 mm/h (ref 0–20)

## 2022-11-21 LAB — ANA: Anti Nuclear Antibody (ANA): NEGATIVE

## 2022-11-21 LAB — ANTI-SMITH ANTIBODY: ENA SM Ab Ser-aCnc: <1 AI

## 2022-11-21 LAB — RNP ANTIBODY: Ribonucleic Protein(ENA) Antibody, IgG: <1 AI

## 2022-11-22 NOTE — Progress Notes (Signed)
Glucose is 139. Rest of CMP WNL.  CBC stable. C3 remains elevated, C4 Wnl.  ESR Wnl Smith antibody negative.  ANA negative.   RNP negative  dsDNA is negative.   Labs are not consistent with systemic lupus.

## 2022-11-25 ENCOUNTER — Ambulatory Visit
Admission: RE | Admit: 2022-11-25 | Discharge: 2022-11-25 | Disposition: A | Discharge status: Home / Self Care | Payer: Managed Care, Other (non HMO) | Source: Ambulatory Visit | Attending: Orthopedic Surgery | Admitting: Orthopedic Surgery

## 2022-11-25 DIAGNOSIS — M23321 Other meniscus derangements, posterior horn of medial meniscus, right knee: Secondary | ICD-10-CM

## 2022-11-28 ENCOUNTER — Ambulatory Visit: Payer: Managed Care, Other (non HMO) | Admitting: Orthopedic Surgery

## 2022-11-28 ENCOUNTER — Encounter: Payer: Self-pay | Admitting: Orthopedic Surgery

## 2022-11-28 DIAGNOSIS — M23321 Other meniscus derangements, posterior horn of medial meniscus, right knee: Secondary | ICD-10-CM

## 2022-11-28 NOTE — Progress Notes (Signed)
Orthopedic Surgery Progress Note     Assessment: Patient is a 47 y.o. female with right knee pain and extrusion of the medial meniscus     Plan: -No acute operative intervention -Weight bearing status: as tolerated -Discussed quad strengthening and weight loss for long-term health of the knee -Since her knee pain is better since the last time I saw her, we will hold off on injection but that would be a treatment option to try in the future -She will return to the office on an as-needed basis   ___________________________________________________________________________   Subjective: Patient has had a flare of back pain since last time she was seen so she has not been using her knee as much.  As a result, her knee pain has gotten better.  It is not completely resolved but is notably better since the last time she was in the office.  Pain is still felt mostly on the medial aspect of the knee.  She has decreased her walking significantly with the back pain which is what exacerbated her pain in the past so she is not sure if it is truly better or if it is better because she has been less active and using it less.     Physical Exam:   General: no acute distress, appears stated age Neurologic: alert, answering questions appropriately, following commands Respiratory: unlabored breathing on room air, symmetric chest rise Psychiatric: appropriate affect, normal cadence to speech   MSK:    -Right lower extremity             Knee range of motion from 0-120 EHL/TA/GSC intact, plantar flexes and dorsiflexes toes Sensation intact to light touch in sural, saphenous, tibial, deep peroneal, and superficial peroneal nerve distributions Foot warm and well perfused Ambulates without assistive devices   Imaging: XR of the right knee taken 10/18/2022 was previously independently reviewed and interpreted, showing small osteophyte formation in the patellofemoral joint. No other significant degenerative  changes. No fracture or dislocation seen.    MRI of the right knee from 11/25/2022 was independently reviewed and interpreted, showing slightly extruded medial meniscus and intrasubstance T2 signal.  No meniscus tear seen.  No ACL or PCL injury.  No collateral ligament injury seen.   Patient name: Renee Howard Patient MRN: [id number] Date: 11/28/22

## 2022-12-13 ENCOUNTER — Other Ambulatory Visit: Payer: Self-pay | Admitting: *Deleted

## 2022-12-13 DIAGNOSIS — M0579 Rheumatoid arthritis with rheumatoid factor of multiple sites without organ or systems involvement: Secondary | ICD-10-CM

## 2022-12-13 MED ORDER — HYDROXYCHLOROQUINE SULFATE 200 MG PO TABS
200.0000 mg | ORAL_TABLET | Freq: Two times a day (BID) | ORAL | 0 refills | Status: DC
Start: 2022-12-13 — End: 2023-03-18

## 2022-12-13 NOTE — Telephone Encounter (Signed)
Last Fill: 07/16/2022  Eye exam: 11/19/2022 WNL   Labs: 11/20/2022 Glucose is 139. Rest of CMP WNL. CBC stable. C3 remains elevated, C4 Wnl. ESR Wnl Smith antibody negative.  ANA negative.   RNP negative dsDNA is negative.   Labs are not consistent with systemic lupus.  Next Visit: 05/15/2023  Last Visit: 11/20/2022  [id number] arthritis involving multiple sites with positive rheumatoid factor   Current Dose per office note 11/20/2022: Plaquenil 200 mg 1 tablet by mouth twice daily.   Okay to refill Plaquenil?

## 2022-12-19 ENCOUNTER — Ambulatory Visit: Payer: Managed Care, Other (non HMO) | Admitting: Orthopedic Surgery

## 2023-02-27 NOTE — Progress Notes (Unsigned)
Office Visit Note   Patient: Renee Howard           Date of Birth: 07-Feb-1976           MRN: [id number] Visit Date: 02/28/2023              Requested by: Charisse March, NP-C [address] [place],  [place] [id number] PCP: Charisse March, NP-C   Assessment & Plan: Visit Diagnoses: No diagnosis found.  Plan: ***  Follow-Up Instructions: No follow-ups on file.   Orders:  No orders of the defined types were placed in this encounter.  No orders of the defined types were placed in this encounter.     Procedures: No procedures performed   Clinical Data: No additional findings.   Subjective: No chief complaint on file.   HPI  Review of Systems  Constitutional: Negative.   HENT: Negative.    Eyes: Negative.   Respiratory: Negative.    Cardiovascular: Negative.   Endocrine: Negative.   Musculoskeletal: Negative.   Neurological: Negative.   Hematological: Negative.   Psychiatric/Behavioral: Negative.    All other systems reviewed and are negative.   Objective: Vital Signs: There were no vitals taken for this visit.  Physical Exam Vitals and nursing note reviewed.  Constitutional:      Appearance: She is well-developed.  HENT:     Head: Atraumatic.     Nose: Nose normal.  Eyes:     Extraocular Movements: Extraocular movements intact.  Cardiovascular:     Pulses: Normal pulses.  Pulmonary:     Effort: Pulmonary effort is normal.  Abdominal:     Palpations: Abdomen is soft.  Musculoskeletal:     Cervical back: Neck supple.  Skin:    General: Skin is warm.     Capillary Refill: Capillary refill takes less than 2 seconds.  Neurological:     Mental Status: She is alert. Mental status is at baseline.  Psychiatric:        Behavior: Behavior normal.        Thought Content: Thought content normal.        Judgment: Judgment normal.   Ortho Exam  Specialty Comments:  No specialty comments available.  Imaging: No results  found.   PMFS History: Patient Active Problem List   Diagnosis Date Noted  . Primary osteoarthritis of both hands 12/26/2016  . High risk medication use 06/28/2016  . Anticardiolipin antibody positive 06/18/2016  . Elevated beta-2  06/18/2016  . Numbness 09/03/2014  . Urinary frequency 09/03/2014  . Gait disturbance 09/03/2014  . Arm pain, left 09/03/2014  . Carpal tunnel syndrome 09/03/2014  . Cervical radiculitis 09/03/2014  . Rheumatoid arthritis involving multiple sites with positive rheumatoid factor (HCC) 09/03/2014   Past Medical History:  Diagnosis Date  . Anticardiolipin antibody positive 06/18/2016  . Anxiety   . Arm pain, left 09/03/2014  . Arthritis   . Carpal tunnel syndrome 09/03/2014  . Cervical radiculitis 09/03/2014  . Depression   . DVT (deep venous thrombosis) (HCC)   . Dyspnea on exertion   . Elevated beta-2  06/18/2016  . Gait disturbance 09/03/2014  . Low back pain   . No pertinent past medical history   . Numbness 09/03/2014  . PONV (postoperative nausea and vomiting)   . Primary osteoarthritis of both hands 12/26/2016  . Pulmonary embolism (HCC) 11/18/2020   per patient  . Rheumatoid arthritis involving multiple sites with positive rheumatoid factor (HCC) 09/03/2014  . Urinary  frequency 09/03/2014    Family History  Problem Relation Age of Onset  . Gallbladder disease Mother   . Gallstones Father   . Hypertension Father   . Asperger's syndrome Brother   . Cancer - Ovarian Paternal Grandmother   . Healthy Son     Past Surgical History:  Procedure Laterality Date  . CESAREAN SECTION     2009  . HERNIA REPAIR     1978   Social History   Occupational History  . Not on file  Tobacco Use  . Smoking status: Never    Passive exposure: Never  . Smokeless tobacco: Never  Vaping Use  . Vaping status: Never Used  Substance and Sexual Activity  . Alcohol use: Yes    Comment: Rarely  . Drug use: No  . Sexual activity: Yes    Birth  control/protection: None

## 2023-02-28 ENCOUNTER — Other Ambulatory Visit (INDEPENDENT_AMBULATORY_CARE_PROVIDER_SITE_OTHER): Payer: Self-pay

## 2023-02-28 ENCOUNTER — Ambulatory Visit: Payer: Managed Care, Other (non HMO) | Admitting: Orthopaedic Surgery

## 2023-02-28 ENCOUNTER — Encounter: Payer: Self-pay | Admitting: Orthopaedic Surgery

## 2023-02-28 DIAGNOSIS — M25562 Pain in left knee: Secondary | ICD-10-CM

## 2023-02-28 MED ORDER — BUPIVACAINE HCL 0.5 % IJ SOLN
2.0000 mL | INTRAMUSCULAR | Status: AC | PRN
Start: 2023-02-28 — End: 2023-02-28
  Administered 2023-02-28: 2 mL via INTRA_ARTICULAR

## 2023-02-28 MED ORDER — LIDOCAINE HCL 1 % IJ SOLN
2.0000 mL | INTRAMUSCULAR | Status: AC | PRN
Start: 2023-02-28 — End: 2023-02-28
  Administered 2023-02-28: 2 mL

## 2023-02-28 MED ORDER — METHYLPREDNISOLONE ACETATE 40 MG/ML IJ SUSP
40.0000 mg | INTRAMUSCULAR | Status: AC | PRN
Start: 2023-02-28 — End: 2023-02-28
  Administered 2023-02-28: 40 mg via INTRA_ARTICULAR

## 2023-03-18 ENCOUNTER — Other Ambulatory Visit: Payer: Self-pay | Admitting: Physician Assistant

## 2023-03-18 DIAGNOSIS — M0579 Rheumatoid arthritis with rheumatoid factor of multiple sites without organ or systems involvement: Secondary | ICD-10-CM

## 2023-03-18 NOTE — Telephone Encounter (Signed)
 Last Fill: 12/13/2022  Eye exam: 11/19/2022 WNL    Labs: 11/20/2022 Glucose is 139. Rest of CMP WNL. CBC stable.   Next Visit: 05/15/2023  Last Visit: 11/20/2022  IK:Myzlfjunpi arthritis involving multiple sites with positive rheumatoid factor   Current Dose per office note 12/13/2022: Plaquenil  200 mg 1 tablet by mouth twice daily.   Okay to refill Plaquenil ?

## 2023-04-25 NOTE — Progress Notes (Signed)
 Office Visit Note  Patient: Renee Howard             Date of Birth: 09-27-1975           MRN: [id number]             PCP: Charisse March, NP-C Referring: Charisse March, NP-C Visit Date: 05/09/2023 Occupation: @GUAROCC @  Subjective:  Left shoulder and left knee pain  History of Present Illness: [name] is a 48 y.o. female with seropositive rheumatoid arthritis and osteoarthritis.  She returns today after her last visit in September 2024.  She states she has been having progressively increasing pain in her left shoulder for the last 2 months.  She is having difficulty raising her left arm.  She has progressively increasing pain in her left knee joint for the last 6 months.  She was evaluated by Dr. Deno Etienne in December 2024.  He obtained x-rays of the left knee joint and gave her a cortisone injection.  She states cortisone injection lasted for a short time and then the pain recurred.  She noticed some swelling in her left knee joint.  She takes Plaquenil 200 mg p.o. twice a day without any interruption.  None of the other joints are painful or swollen.    Activities of Daily Living:  Patient reports morning stiffness for 20-30 minutes.   Patient Reports nocturnal pain.  Difficulty dressing/grooming: Reports Difficulty climbing stairs: Reports Difficulty getting out of chair: Reports Difficulty using hands for taps, buttons, cutlery, and/or writing: Denies  Review of Systems  Constitutional:  Positive for fatigue.  HENT:  Negative for mouth sores and mouth dryness.   Eyes:  Positive for dryness.  Respiratory:  Negative for difficulty breathing.   Cardiovascular:  Negative for chest pain and palpitations.  Gastrointestinal:  Positive for constipation and diarrhea. Negative for blood in stool.  Endocrine: Negative for increased urination.  Genitourinary:  Negative for involuntary urination.  Musculoskeletal:  Positive for joint pain, gait problem, joint pain, joint  swelling, myalgias, morning stiffness and myalgias. Negative for muscle weakness and muscle tenderness.  Skin:  Positive for sensitivity to sunlight. Negative for color change, rash and hair loss.  Allergic/Immunologic: Negative for susceptible to infections.  Neurological:  Positive for dizziness and headaches.  Hematological:  Negative for swollen glands.  Psychiatric/Behavioral:  Positive for depressed mood and sleep disturbance. The patient is not nervous/anxious.     PMFS History:  Patient Active Problem List   Diagnosis Date Noted   Primary osteoarthritis of both hands 12/26/2016   High risk medication use 06/28/2016   Anticardiolipin antibody positive 06/18/2016   Elevated beta-2  06/18/2016   Numbness 09/03/2014   Urinary frequency 09/03/2014   Gait disturbance 09/03/2014   Arm pain, left 09/03/2014   Carpal tunnel syndrome 09/03/2014   Cervical radiculitis 09/03/2014   Rheumatoid arthritis involving multiple sites with positive rheumatoid factor (HCC) 09/03/2014    Past Medical History:  Diagnosis Date   Anticardiolipin antibody positive 06/18/2016   Anxiety    Arm pain, left 09/03/2014   Arthritis    Carpal tunnel syndrome 09/03/2014   Cervical radiculitis 09/03/2014   Depression    DVT (deep venous thrombosis) (HCC)    Dyspnea on exertion    Elevated beta-2  06/18/2016   Gait disturbance 09/03/2014   Low back pain    No pertinent past medical history    Numbness 09/03/2014   PONV (postoperative nausea and vomiting)    Primary osteoarthritis  of both hands 12/26/2016   Pulmonary embolism (HCC) 11/18/2020   per patient   Rheumatoid arthritis involving multiple sites with positive rheumatoid factor (HCC) 09/03/2014   Urinary frequency 09/03/2014    Family History  Problem Relation Age of Onset   Gallbladder disease Mother    Gallstones Father    Hypertension Father    Asperger's syndrome Brother    Cancer - Ovarian Paternal Grandmother    Healthy Son     Past Surgical History:  Procedure Laterality Date   CESAREAN SECTION     2009   HERNIA REPAIR     1978   Social History   Social History Narrative   Not on file   Immunization History  Administered Date(s) Administered   Moderna Sars-Covid-2 Vaccination 05/23/2019, 06/23/2019   PFIZER(Purple Top)SARS-COV-2 Vaccination 01/04/2020     Objective: Vital Signs: BP 137/82 (BP Location: Left Arm, Patient Position: Sitting, Cuff Size: Large)   Pulse 80   Resp 16   Ht 5\' 10"  (1.778 m)   Wt (!) 392 lb (177.8 kg)   Breastfeeding No   BMI 56.25 kg/m    Physical Exam Vitals and nursing note reviewed.  Constitutional:      Appearance: She is well-developed.  HENT:     Head: Normocephalic and atraumatic.  Eyes:     Conjunctiva/sclera: Conjunctivae normal.  Cardiovascular:     Rate and Rhythm: Normal rate and regular rhythm.     Heart sounds: Normal heart sounds.  Pulmonary:     Effort: Pulmonary effort is normal.     Breath sounds: Normal breath sounds.  Abdominal:     General: Bowel sounds are normal.     Palpations: Abdomen is soft.  Musculoskeletal:     Cervical back: Normal range of motion.  Lymphadenopathy:     Cervical: No cervical adenopathy.  Skin:    General: Skin is warm and dry.     Capillary Refill: Capillary refill takes less than 2 seconds.  Neurological:     Mental Status: She is alert and oriented to person, place, and time.  Psychiatric:        Behavior: Behavior normal.      Musculoskeletal Exam: Cervical spine was in good range of motion.  She had painful range of motion of her left shoulder joint with tenderness over the subacromial region.  Elbow joints, wrist joints, MCPs PIPs and DIPs Juengel range of motion with no synovitis.  Hip joints with good range of motion.  She had warmth on palpation of her bilateral knee joints with discomfort on range of motion of the left knee joint.  There was no tenderness over ankles or MTPs.  CDAI Exam: CDAI  Score: 13  Patient Global: 40 / 100; Provider Global: 40 / 100 Swollen: 2 ; Tender: 3  Joint Exam 05/09/2023      Right  Left  Glenohumeral      Tender  Knee  Swollen Tender  Swollen Tender     Investigation: No additional findings.  Imaging: No results found.  Recent Labs: Lab Results  Component Value Date   WBC 12.4 (H) 11/20/2022   HGB 13.3 11/20/2022   PLT 351 11/20/2022   NA 139 11/20/2022   K 4.3 11/20/2022   CL 103 11/20/2022   CO2 25 11/20/2022   GLUCOSE 139 (H) 11/20/2022   BUN 21 11/20/2022   CREATININE 0.84 11/20/2022   BILITOT 0.5 11/20/2022   ALKPHOS 70 10/06/2013   AST 17 11/20/2022  ALT 20 11/20/2022   PROT 6.9 11/20/2022   ALBUMIN 3.9 10/06/2013   CALCIUM 9.1 11/20/2022   GFRAA 94 04/21/2020    Speciality Comments: PLQ eye exam: 11/19/2022 [facility]. Follow up in 6 months.   Procedures:  No procedures performed Allergies: Patient has no known allergies.   Assessment / Plan:     Visit Diagnoses: Rheumatoid arthritis involving multiple sites with positive rheumatoid factor (HCC) - Positive RF, positive ANA: She has been having increased pain and discomfort in her left shoulder and left knee joint.  Warmth is noted in bilateral knee joints.  She was evaluated by Dr. Deno Etienne in December and had cortisone injection to her left knee joint.  She states the relief lasted for only few weeks and then the pain recurred.  None of the joints were inflamed today.  Patient is on chronic anticoagulation.  She does not want to take any contraception.  I discussed the option of sulfasalazine.  A handout was given for review.  Side effects were reviewed at length.  Patient will make that decision and will let us know if she wants to restart sulfasalazine.  She had a detailed discussion with the pharmacist.  High risk medication use - Plaquenil 200 mg 1 tablet by mouth twice daily. PLQ eye exam: 11/19/2022 - Plan: CBC with Differential/Platelet, COMPLETE  METABOLIC PANEL WITH GFR, Glucose 6 phosphate dehydrogenase  Chronic left shoulder pain -she has been having pain and discomfort in her left shoulder and difficulty raising her left arm.  She had tenderness over the subacromial region.  I offered a cortisone injection which she declined.  Plan: XR Shoulder Left.  X-rays showed acromial spurring.  No significant acromioclavicular joint space narrowing was noted.  Glenohumeral joint was normal.  A handout on shoulder joint exercises were given.  Primary osteoarthritis of both hands-she had no synovitis on the examination today.  Chronic pain of left knee-she had warmth on palpation of bilateral knee joints today.  Cortisone injection in December 2024 given by Dr. Deno Etienne lasted for only few weeks.  Primary osteoarthritis of right knee-she is currently not having discomfort.  I reviewed previous x-rays which showed moderate osteoarthritis of the right knee.  Anticardiolipin antibody positive - initially had positive anticardiolipin and beta-2 antibodies.  Repeat antibody testing was negative in 2018 and 09/20/2020. She remains on Eliquis.  Elevated beta-2  - Patient remains on Eliquis.  Bilateral pulmonary embolism (HCC) - Remains on eliquis  Facial rash - facial rash x 3 years.  The rash seems to wax and wane.  Chronic midline low back pain without sciatica - Evaluated by Dr. Christell Constant.  Muscle spasm of back  Obstructive sleep apnea syndrome  History of depression  Orders: Orders Placed This Encounter  Procedures   XR Shoulder Left   CBC with Differential/Platelet   COMPLETE METABOLIC PANEL WITH GFR   Glucose 6 phosphate dehydrogenase   No orders of the defined types were placed in this encounter.    Follow-Up Instructions: Return in about 5 months (around 10/06/2023) for Rheumatoid arthritis.   Pollyann Savoy, MD  Note - This record has been created using Animal nutritionist.  Chart creation errors have been sought, but may not always   have been located. Such creation errors do not reflect on  the standard of medical care.

## 2023-05-09 ENCOUNTER — Ambulatory Visit (INDEPENDENT_AMBULATORY_CARE_PROVIDER_SITE_OTHER): Payer: Managed Care, Other (non HMO)

## 2023-05-09 ENCOUNTER — Encounter: Payer: Self-pay | Admitting: Rheumatology

## 2023-05-09 ENCOUNTER — Ambulatory Visit: Payer: Managed Care, Other (non HMO) | Attending: Rheumatology | Admitting: Rheumatology

## 2023-05-09 VITALS — BP 137/82 | HR 80 | Resp 16 | Ht 70.0 in | Wt 392.0 lb

## 2023-05-09 DIAGNOSIS — M25512 Pain in left shoulder: Secondary | ICD-10-CM | POA: Diagnosis not present

## 2023-05-09 DIAGNOSIS — G8929 Other chronic pain: Secondary | ICD-10-CM

## 2023-05-09 DIAGNOSIS — R21 Rash and other nonspecific skin eruption: Secondary | ICD-10-CM

## 2023-05-09 DIAGNOSIS — M19041 Primary osteoarthritis, right hand: Secondary | ICD-10-CM

## 2023-05-09 DIAGNOSIS — M545 Low back pain, unspecified: Secondary | ICD-10-CM

## 2023-05-09 DIAGNOSIS — M0579 Rheumatoid arthritis with rheumatoid factor of multiple sites without organ or systems involvement: Secondary | ICD-10-CM

## 2023-05-09 DIAGNOSIS — I2699 Other pulmonary embolism without acute cor pulmonale: Secondary | ICD-10-CM

## 2023-05-09 DIAGNOSIS — R760 Raised antibody titer: Secondary | ICD-10-CM

## 2023-05-09 DIAGNOSIS — R7989 Other specified abnormal findings of blood chemistry: Secondary | ICD-10-CM

## 2023-05-09 DIAGNOSIS — M1711 Unilateral primary osteoarthritis, right knee: Secondary | ICD-10-CM

## 2023-05-09 DIAGNOSIS — Z8659 Personal history of other mental and behavioral disorders: Secondary | ICD-10-CM

## 2023-05-09 DIAGNOSIS — M19042 Primary osteoarthritis, left hand: Secondary | ICD-10-CM

## 2023-05-09 DIAGNOSIS — G4733 Obstructive sleep apnea (adult) (pediatric): Secondary | ICD-10-CM

## 2023-05-09 DIAGNOSIS — M25562 Pain in left knee: Secondary | ICD-10-CM

## 2023-05-09 DIAGNOSIS — M62830 Muscle spasm of back: Secondary | ICD-10-CM

## 2023-05-09 DIAGNOSIS — Z79899 Other long term (current) drug therapy: Secondary | ICD-10-CM | POA: Diagnosis not present

## 2023-05-09 NOTE — Patient Instructions (Addendum)
 Sulfasalazine Delayed-Release Tablets What is this medication? SULFASALAZINE (sul fa SAL a zeen) treats ulcerative colitis. It may also be used to treat rheumatoid arthritis when other medications have not worked or cannot be tolerated. It works by decreasing inflammation. It belongs to a group of medications called salicylates. This medicine may be used for other purposes; ask your health care provider or pharmacist if you have questions. COMMON BRAND NAME(S): Azulfidine En-Tabs, Sulfazine EC What should I tell my care team before I take this medication? They need to know if you have any of these conditions: Asthma Blood disorders or anemia Glucose-6-phosphate dehydrogenase (G6PD) deficiency Intestinal obstruction Kidney disease Liver disease Porphyria Urinary tract obstruction An unusual reaction to sulfasalazine, sulfa medications, salicylates, other medications, foods, dyes, or preservatives Pregnant or trying to get pregnant Breast-feeding How should I use this medication? Take this medication by mouth with a full glass of water. Take it as directed on the prescription label at the same time every day. Do not cut, crush, or chew this medication. Swallow capsules whole. You can take it with or without food. If it upsets your stomach, take it with food. Keep taking it unless your care team tells you to stop. Talk to your care team about the use of this medication in children. Special care may be needed. While this medication may be prescribed for children as young as 6 years for selected conditions, precautions do apply. Patients over 84 years old may have a stronger reaction and need a smaller dose. Overdosage: If you think you have taken too much of this medicine contact a poison control center or emergency room at once. NOTE: This medicine is only for you. Do not share this medicine with others. What if I miss a dose? If you miss a dose, take it as soon as you can. If it is almost time  for your next dose, take only that dose. Do not take double or extra doses. What may interact with this medication? Digoxin Folic acid This list may not describe all possible interactions. Give your health care provider a list of all the medicines, herbs, non-prescription drugs, or dietary supplements you use. Also tell them if you smoke, drink alcohol, or use illegal drugs. Some items may interact with your medicine. What should I watch for while using this medication? Visit your care team for regular checks on your progress. Tell your care team if your symptoms do not start to get better or if they get worse. You will need frequent blood and urine checks. This medication can make you more sensitive to the sun. Keep out of the sun. If you cannot avoid being in the sun, wear protective clothing and use sunscreen. Do not use sun lamps or tanning beds/booths. Drink plenty of water while taking this medication. Tell your care team if you see the tablet in your stools. Your body may not be absorbing the medication. What side effects may I notice from receiving this medication? Side effects that you should report to your care team as soon as possible: Allergic reactions--skin rash, itching, hives, swelling of the face, lips, tongue, or throat Aplastic anemia--unusual weakness or fatigue, dizziness, headache, trouble breathing, increased bleeding or bruising Dry cough, shortness of breath or trouble breathing Heart muscle inflammation--unusual weakness or fatigue, shortness of breath, chest pain, fast or irregular heartbeat, dizziness, swelling of the ankles, feet, or hands Infection--fever, chills, cough, sore throat, wounds that don't heal, pain or trouble when passing urine, general feeling of discomfort  or being unwell Kidney injury--decrease in the amount of urine, swelling of the ankles, hands, or feet Liver injury--right upper belly pain, loss of appetite, nausea, light-colored stool, dark yellow  or brown urine, yellowing skin or eyes, unusual weakness or fatigue Rash, fever, and swollen lymph nodes Redness, blistering, peeling, or loosening of the skin, including inside the mouth Side effects that usually do not require medical attention (report to your care team if they continue or are bothersome): Dark yellow or orange saliva, sweat, or urine Dizziness Headache Loss of appetite Nausea Upset stomach Vomiting This list may not describe all possible side effects. Call your doctor for medical advice about side effects. You may report side effects to FDA at 1-800-FDA-1088. Where should I keep my medication? Keep out of the reach of children and pets. Store at room temperature between 15 and 30 degrees C (59 and 86 degrees F). Get rid of any unused medication after the expiration date. To get rid of medications that are no longer needed or have expired: Take the medications to a medication take-back program. Check with your pharmacy or law enforcement to find a location. If your cannot return the medication, check the label or package insert to see if the medication should be thrown out in the garbage or flushed down the toilet. If you are not sure, ask your care team. If it is safe to put it in the trash, take the medication out of the container. Mix the medication with cat litter, dirt, coffee grounds, or other unwanted substance. Seal the mixture in a bag or container. Put it in the trash. NOTE: This sheet is a summary. It may not cover all possible information. If you have questions about this medicine, talk to your doctor, pharmacist, or health care provider.  2024 Elsevier/Gold Standard (2021-01-19 00:00:00)  Standing Labs We placed an order today for your standing lab work.   Please have your standing labs drawn in 1 month after starting sulfasalazine and then every 3 months  Please have your labs drawn 2 weeks prior to your appointment so that the provider can discuss your lab  results at your appointment, if possible.  Please note that you may see your imaging and lab results in MyChart before we have reviewed them. We will contact you once all results are reviewed. Please allow our office up to 72 hours to thoroughly review all of the results before contacting the office for clarification of your results.  WALK-IN LAB HOURS  Monday through Thursday from 8:00 am -12:30 pm and 1:00 pm-5:00 pm and Friday from 8:00 am-12:00 pm.  Patients with office visits requiring labs will be seen before walk-in labs.  You may encounter longer than normal wait times. Please allow additional time. Wait times may be shorter on  Monday and Thursday afternoons.  We do not book appointments for walk-in labs. We appreciate your patience and understanding with our staff.   Labs are drawn by [facility]. Please bring your co-pay at the time of your lab draw.  You may receive a bill from [facility] for your lab work.  Please note if you are on Hydroxychloroquine and and an order has been placed for a Hydroxychloroquine level,  you will need to have it drawn 4 hours or more after your last dose.  If you wish to have your labs drawn at another location, please call the office 24 hours in advance so we can fax the orders.  The office is located at [address]  [address], [place], [place] [address]   If you have any questions regarding directions or hours of operation,  please call [phone].   As a reminder, please drink plenty of water prior to coming for your lab work. Thanks!   Vaccines You are taking a medication(s) that can suppress your immune system.  The following immunizations are recommended: Flu annually Covid-19  Td/Tdap (tetanus, diphtheria, pertussis) every 10 years Pneumonia (Prevnar 15 then Pneumovax 23 at least 1 year apart.  Alternatively, can take Prevnar 20 without needing additional dose) Shingrix: 2 doses from 4 weeks to 6 months apart  Please check with your PCP to  make sure you are up to date.   Shoulder Exercises Ask your health care provider which exercises are safe for you. Do exercises exactly as told by your health care provider and adjust them as directed. It is normal to feel mild stretching, pulling, tightness, or discomfort as you do these exercises. Stop right away if you feel sudden pain or your pain gets worse. Do not begin these exercises until told by your health care provider. Stretching exercises External rotation and abduction This exercise is sometimes called corner stretch. The exercise rotates your arm outward (external rotation) and moves your arm out from your body (abduction). Stand in a doorway with one of your feet slightly in front of the other. This is called a staggered stance. If you cannot reach your forearms to the door frame, stand facing a corner of a room. Choose one of the following positions as told by your health care provider: Place your hands and forearms on the door frame above your head. Place your hands and forearms on the door frame at the height of your head. Place your hands on the door frame at the height of your elbows. Slowly move your weight onto your front foot until you feel a stretch across your chest and in the front of your shoulders. Keep your head and chest upright and keep your abdominal muscles tight. Hold for __________ seconds. To release the stretch, shift your weight to your back foot. Repeat __________ times. Complete this exercise __________ times a day. Extension, standing  Stand and hold a broomstick, a cane, or a similar object behind your back. Your hands should be a little wider than shoulder-width apart. Your palms should face away from your back. Keeping your elbows straight and your shoulder muscles relaxed, move the stick away from your body until you feel a stretch in your shoulders (extension). Avoid shrugging your shoulders while you move the stick. Keep your shoulder blades  tucked down toward the middle of your back. Hold for __________ seconds. Slowly return to the starting position. Repeat __________ times. Complete this exercise __________ times a day. Range-of-motion exercises Pendulum  Stand near a wall or a surface that you can hold onto for balance. Bend at the waist and let your left / right arm hang straight down. Use your other arm to support you. Keep your back straight and do not lock your knees. Relax your left / right arm and shoulder muscles, and move your hips and your trunk so your left / right arm swings freely. Your arm should swing because of the motion of your body, not because you are using your arm or shoulder muscles. Keep moving your hips and trunk so your arm swings in the following directions, as told by your health care provider: Side to side. Forward and backward. In clockwise and counterclockwise circles. Continue each  motion for __________ seconds, or for as long as told by your health care provider. Slowly return to the starting position. Repeat __________ times. Complete this exercise __________ times a day. Shoulder flexion, standing  Stand and hold a broomstick, a cane, or a similar object. Place your hands a little more than shoulder-width apart on the object. Your left / right hand should be palm-up, and your other hand should be palm-down. Keep your elbow straight and your shoulder muscles relaxed. Push the stick up with your healthy arm to raise your left / right arm in front of your body, and then over your head until you feel a stretch in your shoulder (flexion). Avoid shrugging your shoulder while you raise your arm. Keep your shoulder blade tucked down toward the middle of your back. Hold for __________ seconds. Slowly return to the starting position. Repeat __________ times. Complete this exercise __________ times a day. Shoulder abduction, standing  Stand and hold a broomstick, a cane, or a similar object. Place  your hands a little more than shoulder-width apart on the object. Your left / right hand should be palm-up, and your other hand should be palm-down. Keep your elbow straight and your shoulder muscles relaxed. Push the object across your body toward your left / right side. Raise your left / right arm to the side of your body (abduction) until you feel a stretch in your shoulder. Do not raise your arm above shoulder height unless your health care provider tells you to do that. If directed, raise your arm over your head. Avoid shrugging your shoulder while you raise your arm. Keep your shoulder blade tucked down toward the middle of your back. Hold for __________ seconds. Slowly return to the starting position. Repeat __________ times. Complete this exercise __________ times a day. Internal rotation  Place your left / right hand behind your back, palm-up. Use your other hand to dangle an exercise band, a broomstick, or a similar object over your shoulder. Grasp the band with your left / right hand so you are holding on to both ends. Gently pull up on the band until you feel a stretch in the front of your left / right shoulder. The movement of your arm toward the center of your body is called internal rotation. Avoid shrugging your shoulder while you raise your arm. Keep your shoulder blade tucked down toward the middle of your back. Hold for __________ seconds. Release the stretch by letting go of the band and lowering your hands. Repeat __________ times. Complete this exercise __________ times a day. Strengthening exercises External rotation  Sit in a stable chair without armrests. Secure an exercise band to a stable object at elbow height on your left / right side. Place a soft object, such as a folded towel or a small pillow, between your left / right upper arm and your body to move your elbow about 4 inches (10 cm) away from your side. Hold the end of the exercise band so it is tight and  there is no slack. Keeping your elbow pressed against the soft object, slowly move your forearm out, away from your abdomen (external rotation). Keep your body steady so only your forearm moves. Hold for __________ seconds. Slowly return to the starting position. Repeat __________ times. Complete this exercise __________ times a day. Shoulder abduction  Sit in a stable chair without armrests, or stand up. Hold a __________ lb / kg weight in your left / right hand, or hold an exercise  band with both hands. Start with your arms straight down and your left / right palm facing in, toward your body. Slowly lift your left / right hand out to your side (abduction). Do not lift your hand above shoulder height unless your health care provider tells you that this is safe. Keep your arms straight. Avoid shrugging your shoulder while you do this movement. Keep your shoulder blade tucked down toward the middle of your back. Hold for __________ seconds. Slowly lower your arm, and return to the starting position. Repeat __________ times. Complete this exercise __________ times a day. Shoulder extension  Sit in a stable chair without armrests, or stand up. Secure an exercise band to a stable object in front of you so it is at shoulder height. Hold one end of the exercise band in each hand. Straighten your elbows and lift your hands up to shoulder height. Squeeze your shoulder blades together as you pull your hands down to the sides of your thighs (extension). Stop when your hands are straight down by your sides. Do not let your hands go behind your body. Hold for __________ seconds. Slowly return to the starting position. Repeat __________ times. Complete this exercise __________ times a day. Shoulder row  Sit in a stable chair without armrests, or stand up. Secure an exercise band to a stable object in front of you so it is at chest height. Hold one end of the exercise band in each hand. Position your  palms so that your thumbs are facing the ceiling (neutral position). Bend each of your elbows to a 90-degree angle (right angle) and keep your upper arms at your sides. Step back or move the chair back until the band is tight and there is no slack. Slowly pull your elbows back behind you. Hold for __________ seconds. Slowly return to the starting position. Repeat __________ times. Complete this exercise __________ times a day. Shoulder press-ups  Sit in a stable chair that has armrests. Sit upright, with your feet flat on the floor. Put your hands on the armrests so your elbows are bent and your fingers are pointing forward. Your hands should be about even with the sides of your body. Push down on the armrests and use your arms to lift yourself off the chair. Straighten your elbows and lift yourself up as much as you comfortably can. Move your shoulder blades down, and avoid letting your shoulders move up toward your ears. Keep your feet on the ground. As you get stronger, your feet should support less of your body weight as you lift yourself up. Hold for __________ seconds. Slowly lower yourself back into the chair. Repeat __________ times. Complete this exercise __________ times a day. Wall push-ups  Stand so you are facing a stable wall. Your feet should be about one arm-length away from the wall. Lean forward and place your palms on the wall at shoulder height. Keep your feet flat on the floor as you bend your elbows and lean forward toward the wall. Hold for __________ seconds. Straighten your elbows to push yourself back to the starting position. Repeat __________ times. Complete this exercise __________ times a day. This information is not intended to replace advice given to you by your health care provider. Make sure you discuss any questions you have with your health care provider. Document Revised: 04/18/2021 Document Reviewed: 04/18/2021 Elsevier Patient Education  2024  [employer].

## 2023-05-09 NOTE — Progress Notes (Signed)
 Pharmacy Note  Subjective:  Patient presents today to [facility] for follow up office visit.  Patient seen by pharmacist for counseling on sulfasalazine for rheumatoid arthritis.  Patient currently taking hydroxychloroquine. She is concerned about medications interacting with her Eliquis. She also has fatty liver disease and some baseline GI symptoms that she does not want to aggravate   Objective: CMP     Component Value Date/Time   NA 139 11/20/2022 0846   K 4.3 11/20/2022 0846   CL 103 11/20/2022 0846   CO2 25 11/20/2022 0846   GLUCOSE 139 (H) 11/20/2022 0846   BUN 21 11/20/2022 0846   CREATININE 0.84 11/20/2022 0846   CALCIUM 9.1 11/20/2022 0846   PROT 6.9 11/20/2022 0846   ALBUMIN 3.9 10/06/2013 1735   AST 17 11/20/2022 0846   ALT 20 11/20/2022 0846   ALKPHOS 70 10/06/2013 1735   BILITOT 0.5 11/20/2022 0846   GFRNONAA 81 04/21/2020 1436   GFRAA 94 04/21/2020 1436    CBC    Component Value Date/Time   WBC 12.4 (H) 11/20/2022 0846   RBC 5.17 (H) 11/20/2022 0846   HGB 13.3 11/20/2022 0846   HCT 41.0 11/20/2022 0846   PLT 351 11/20/2022 0846   MCV 79.3 (L) 11/20/2022 0846   MCH 25.7 (L) 11/20/2022 0846   MCHC 32.4 11/20/2022 0846   RDW 14.6 11/20/2022 0846   LYMPHSABS 1,897 11/20/2022 0846   MONOABS 0.6 10/06/2013 1735   EOSABS 62 11/20/2022 0846   BASOSABS 37 11/20/2022 0846     Baseline Immunosuppressant Therapy Labs TB GOLD   Hepatitis Panel   HIV No results found for: "HIV" Immunoglobulins   SPEP    Latest Ref Rng & Units 11/20/2022    8:46 AM  Serum Protein Electrophoresis  Total Protein 6.1 - 8.1 g/dL 6.9    J4NW No results found for: "G6PDH" TPMT No results found for: "TPMT"   Chest x-ray: 09/30/2020 - No active cardiopulmonary disease.  Does the patient have an allergy to sulfa drugs? No  Assessment/Plan: Patient was counseled on the purpose, proper use, and adverse effects of sulfasalazine including risk of infection and  chance of nausea, headache, and sun sensitivity.  Also discussed risk of skin rash and advised patient to stop the medication and let us know if she develops a rash. Also discussed for the potential of discoloration of the urine, sweat, or tears.  Advised patient to avoid live vaccines.  Recommend annual influenza, Pneumovax 23, Prevnar 13, and Shingrix as indicated.   Reviewed the importance of frequent labs to monitor liver, kidneys, and blood counts.  Standing orders placed and patient to return 1 month after starting therapy and then every 3 months.  Provided patient with educational materials on sulfasalazine and answered all questions.    Patient dose will be sulfasalazine 500mg  twice daily x 1 weeks, then 1000 mg p.o. twice daily.    Patient did not consent to SSZ today. I gave her consent form to sign and complete and fax to clinic if she decides to move forward with treatment  Chesley Mires, PharmD, MPH, BCPS, CPP Clinical Pharmacist (Rheumatology and Pulmonology)

## 2023-05-10 NOTE — Progress Notes (Signed)
 CBC and CMP normal.  G6 PD pending

## 2023-05-15 ENCOUNTER — Ambulatory Visit: Payer: Managed Care, Other (non HMO) | Admitting: Rheumatology

## 2023-05-16 LAB — CBC WITH DIFFERENTIAL/PLATELET
Absolute Lymphocytes: 1768 {cells}/uL (ref 850–3900)
Absolute Monocytes: 667 {cells}/uL (ref 200–950)
Basophils Absolute: 61 {cells}/uL (ref 0–200)
Basophils Relative: 0.6 %
Eosinophils Absolute: 192 {cells}/uL (ref 15–500)
Eosinophils Relative: 1.9 %
HCT: 39.1 % (ref 35.0–45.0)
Hemoglobin: 12.5 g/dL (ref 11.7–15.5)
MCH: 25.9 pg — ABNORMAL LOW (ref 27.0–33.0)
MCHC: 32 g/dL (ref 32.0–36.0)
MCV: 81 fL (ref 80.0–100.0)
MPV: 9.2 fL (ref 7.5–12.5)
Monocytes Relative: 6.6 %
Neutro Abs: 7413 {cells}/uL (ref 1500–7800)
Neutrophils Relative %: 73.4 %
Platelets: 384 10*3/uL (ref 140–400)
RBC: 4.83 10*6/uL (ref 3.80–5.10)
RDW: 13.8 % (ref 11.0–15.0)
Total Lymphocyte: 17.5 %
WBC: 10.1 10*3/uL (ref 3.8–10.8)

## 2023-05-16 LAB — COMPLETE METABOLIC PANEL WITH GFR
AG Ratio: 1.3 (calc) (ref 1.0–2.5)
ALT: 18 U/L (ref 6–29)
AST: 15 U/L (ref 10–35)
Albumin: 3.9 g/dL (ref 3.6–5.1)
Alkaline phosphatase (APISO): 73 U/L (ref 31–125)
BUN: 15 mg/dL (ref 7–25)
CO2: 26 mmol/L (ref 20–32)
Calcium: 9.2 mg/dL (ref 8.6–10.2)
Chloride: 105 mmol/L (ref 98–110)
Creat: 0.78 mg/dL (ref 0.50–0.99)
Globulin: 2.9 g/dL (ref 1.9–3.7)
Glucose, Bld: 82 mg/dL (ref 65–99)
Potassium: 4.5 mmol/L (ref 3.5–5.3)
Sodium: 138 mmol/L (ref 135–146)
Total Bilirubin: 0.5 mg/dL (ref 0.2–1.2)
Total Protein: 6.8 g/dL (ref 6.1–8.1)
eGFR: 94 mL/min/{1.73_m2} (ref >=60)

## 2023-05-16 LAB — GLUCOSE 6 PHOSPHATE DEHYDROGENASE: G-6PDH: 17.8 U/g{Hb} (ref 7.0–20.5)

## 2023-05-16 NOTE — Progress Notes (Signed)
G6PD normal

## 2023-05-30 ENCOUNTER — Other Ambulatory Visit: Payer: Self-pay | Admitting: Physician Assistant

## 2023-05-30 DIAGNOSIS — M0579 Rheumatoid arthritis with rheumatoid factor of multiple sites without organ or systems involvement: Secondary | ICD-10-CM

## 2023-05-30 NOTE — Telephone Encounter (Signed)
 Last Fill: 03/18/2023   Eye exam: 05/22/2023   Labs: 05/09/2023 CBC and CMP normal. G6PD normal.   Next Visit: 10/08/2023  Last Visit: 05/09/2023  [id number] arthritis involving multiple sites with positive rheumatoid factor   Current Dose per office note 05/09/2023: Plaquenil 200 mg 1 tablet by mouth twice daily   Okay to refill Plaquenil?

## 2023-06-10 ENCOUNTER — Encounter: Payer: Self-pay | Admitting: Rheumatology

## 2023-06-10 DIAGNOSIS — Z79899 Other long term (current) drug therapy: Secondary | ICD-10-CM

## 2023-06-10 MED ORDER — SULFASALAZINE 500 MG PO TBEC: 1000.0000 mg | DELAYED_RELEASE_TABLET | Freq: Two times a day (BID) | ORAL | 0 refills | Status: AC

## 2023-06-10 NOTE — Telephone Encounter (Signed)
 It is okay to start her on Azulfidine EN 500 mg 2 tablets p.o. twice daily.  She will need labs in a month, every 3 months.  She should establish with a new rheumatologist as soon as possible.

## 2023-06-24 NOTE — Telephone Encounter (Signed)
 Please make sure that she has Azulfidine EN which is enteric-coated.  She may try 1 tablet twice a day.  If that is tolerated she can increase the dose later.  She may also want to take it with food.  If she cannot tolerate it even at the lower dose then she may have to discuss other treatment options during an office visit.

## 2023-07-09 ENCOUNTER — Other Ambulatory Visit: Payer: Self-pay | Admitting: Rheumatology

## 2023-07-12 ENCOUNTER — Other Ambulatory Visit: Payer: Self-pay | Admitting: *Deleted

## 2023-07-12 DIAGNOSIS — Z79899 Other long term (current) drug therapy: Secondary | ICD-10-CM

## 2023-07-13 LAB — COMPREHENSIVE METABOLIC PANEL WITH GFR
AG Ratio: 1.3 (calc) (ref 1.0–2.5)
ALT: 21 U/L (ref 6–29)
AST: 17 U/L (ref 10–35)
Albumin: 3.9 g/dL (ref 3.6–5.1)
Alkaline phosphatase (APISO): 67 U/L (ref 31–125)
BUN/Creatinine Ratio: 13 (calc) (ref 6–22)
BUN: 13 mg/dL (ref 7–25)
CO2: 26 mmol/L (ref 20–32)
Calcium: 9.4 mg/dL (ref 8.6–10.2)
Chloride: 104 mmol/L (ref 98–110)
Creat: 1.02 mg/dL — ABNORMAL HIGH (ref 0.50–0.99)
Globulin: 3.1 g/dL (ref 1.9–3.7)
Glucose, Bld: 145 mg/dL — ABNORMAL HIGH (ref 65–99)
Potassium: 4.2 mmol/L (ref 3.5–5.3)
Sodium: 138 mmol/L (ref 135–146)
Total Bilirubin: 0.5 mg/dL (ref 0.2–1.2)
Total Protein: 7 g/dL (ref 6.1–8.1)
eGFR: 68 mL/min/{1.73_m2} (ref >=60)

## 2023-07-13 LAB — CBC WITH DIFFERENTIAL/PLATELET
Absolute Lymphocytes: 1548 {cells}/uL (ref 850–3900)
Absolute Monocytes: 339 {cells}/uL (ref 200–950)
Basophils Absolute: 31 {cells}/uL (ref 0–200)
Basophils Relative: 0.4 %
Eosinophils Absolute: 169 {cells}/uL (ref 15–500)
Eosinophils Relative: 2.2 %
HCT: 39 % (ref 35.0–45.0)
Hemoglobin: 12.7 g/dL (ref 11.7–15.5)
MCH: 26.5 pg — ABNORMAL LOW (ref 27.0–33.0)
MCHC: 32.6 g/dL (ref 32.0–36.0)
MCV: 81.3 fL (ref 80.0–100.0)
MPV: 8.9 fL (ref 7.5–12.5)
Monocytes Relative: 4.4 %
Neutro Abs: 5613 {cells}/uL (ref 1500–7800)
Neutrophils Relative %: 72.9 %
Platelets: 338 10*3/uL (ref 140–400)
RBC: 4.8 10*6/uL (ref 3.80–5.10)
RDW: 13.9 % (ref 11.0–15.0)
Total Lymphocyte: 20.1 %
WBC: 7.7 10*3/uL (ref 3.8–10.8)

## 2023-07-14 NOTE — Progress Notes (Signed)
 Glucose is elevated.  Creatinine is mildly elevated probably due to diuretic use.  CBC is stable.  Please forward results to her PCP.

## 2023-08-28 ENCOUNTER — Other Ambulatory Visit: Payer: Self-pay | Admitting: Physician Assistant

## 2023-08-28 DIAGNOSIS — M0579 Rheumatoid arthritis with rheumatoid factor of multiple sites without organ or systems involvement: Secondary | ICD-10-CM

## 2023-08-28 NOTE — Telephone Encounter (Signed)
 Last Fill: 05/30/2023  Eye exam: 05/22/2023 WNL   Labs: 07/12/2023 Glucose is elevated.  Creatinine is mildly elevated probably due to diuretic use.  CBC is stable.   Next Visit: 10/08/2023  Last Visit: 05/09/2023  [id number] arthritis involving multiple sites with positive rheumatoid factor   Current Dose per office note 05/09/2023: Plaquenil  200 mg 1 tablet by mouth twice daily   Okay to refill Plaquenil ?

## 2023-09-17 ENCOUNTER — Encounter: Payer: Self-pay | Admitting: Rheumatology

## 2023-09-17 DIAGNOSIS — M0579 Rheumatoid arthritis with rheumatoid factor of multiple sites without organ or systems involvement: Secondary | ICD-10-CM

## 2023-09-17 DIAGNOSIS — M19041 Primary osteoarthritis, right hand: Secondary | ICD-10-CM

## 2023-09-17 DIAGNOSIS — M1711 Unilateral primary osteoarthritis, right knee: Secondary | ICD-10-CM

## 2023-09-17 DIAGNOSIS — R760 Raised antibody titer: Secondary | ICD-10-CM

## 2023-09-17 DIAGNOSIS — G8929 Other chronic pain: Secondary | ICD-10-CM

## 2023-09-18 NOTE — Telephone Encounter (Signed)
Okay to send the referral

## 2023-09-24 NOTE — Progress Notes (Deleted)
 Office Visit Note  Patient: Renee Howard             Date of Birth: 06/11/1975           MRN: [id number]             PCP: Caroleen Ole RAMAN, NP-C Referring: Caroleen Ole RAMAN, NP-C Visit Date: 10/08/2023 Occupation: @GUAROCC @  Subjective:  No chief complaint on file.   History of Present Illness: Renee Howard is a 48 y.o. female ***     Activities of Daily Living:  Patient reports morning stiffness for *** {minute/hour:19697}.   Patient {ACTIONS;DENIES/REPORTS:21021675::Denies} nocturnal pain.  Difficulty dressing/grooming: {ACTIONS;DENIES/REPORTS:21021675::Denies} Difficulty climbing stairs: {ACTIONS;DENIES/REPORTS:21021675::Denies} Difficulty getting out of chair: {ACTIONS;DENIES/REPORTS:21021675::Denies} Difficulty using hands for taps, buttons, cutlery, and/or writing: {ACTIONS;DENIES/REPORTS:21021675::Denies}  No Rheumatology ROS completed.   PMFS History:  Patient Active Problem List   Diagnosis Date Noted   Primary osteoarthritis of both hands 12/26/2016   High risk medication use 06/28/2016   Anticardiolipin antibody positive 06/18/2016   Elevated beta-2   06/18/2016   Numbness 09/03/2014   Urinary frequency 09/03/2014   Gait disturbance 09/03/2014   Arm pain, left 09/03/2014   Carpal tunnel syndrome 09/03/2014   Cervical radiculitis 09/03/2014   Rheumatoid arthritis involving multiple sites with positive rheumatoid factor (HCC) 09/03/2014    Past Medical History:  Diagnosis Date   Anticardiolipin antibody positive 06/18/2016   Anxiety    Arm pain, left 09/03/2014   Arthritis    Carpal tunnel syndrome 09/03/2014   Cervical radiculitis 09/03/2014   Depression    DVT (deep venous thrombosis) (HCC)    Dyspnea on exertion    Elevated beta-2   06/18/2016   Gait disturbance 09/03/2014   Low back pain    No pertinent past medical history    Numbness 09/03/2014   PONV (postoperative nausea and vomiting)    Primary osteoarthritis of both  hands 12/26/2016   Pulmonary embolism (HCC) 11/18/2020   per patient   Rheumatoid arthritis involving multiple sites with positive rheumatoid factor (HCC) 09/03/2014   Urinary frequency 09/03/2014    Family History  Problem Relation Age of Onset   Gallbladder disease Mother    Gallstones Father    Hypertension Father    Asperger's syndrome Brother    Cancer - Ovarian Paternal Grandmother    Healthy Son    Past Surgical History:  Procedure Laterality Date   CESAREAN SECTION     2009   HERNIA REPAIR     1978   Social History   Social History Narrative   Not on file   Immunization History  Administered Date(s) Administered   Moderna Sars-Covid-2 Vaccination 05/23/2019, 06/23/2019   PFIZER(Purple Top)SARS-COV-2 Vaccination 01/04/2020     Objective: Vital Signs: There were no vitals taken for this visit.   Physical Exam   Musculoskeletal Exam: ***  CDAI Exam: CDAI Score: -- Patient Global: --; Provider Global: -- Swollen: --; Tender: -- Joint Exam 10/08/2023   No joint exam has been documented for this visit   There is currently no information documented on the homunculus. Go to the Rheumatology activity and complete the homunculus joint exam.  Investigation: No additional findings.  Imaging: No results found.  Recent Labs: Lab Results  Component Value Date   WBC 7.7 07/12/2023   HGB 12.7 07/12/2023   PLT 338 07/12/2023   NA 138 07/12/2023   K 4.2 07/12/2023   CL 104 07/12/2023   CO2 26 07/12/2023   GLUCOSE 145 (H) 07/12/2023  BUN 13 07/12/2023   CREATININE 1.02 (H) 07/12/2023   BILITOT 0.5 07/12/2023   ALKPHOS 70 10/06/2013   AST 17 07/12/2023   ALT 21 07/12/2023   PROT 7.0 07/12/2023   ALBUMIN 3.9 10/06/2013   CALCIUM 9.4 07/12/2023   GFRAA 94 04/21/2020    Speciality Comments: PLQ eye exam:05/22/2023 [facility]. Follow up in 6 months.   Procedures:  No procedures performed Allergies: Patient has no known allergies.    Assessment / Plan:     Visit Diagnoses: Rheumatoid arthritis involving multiple sites with positive rheumatoid factor (HCC)  High risk medication use  Chronic left shoulder pain  Primary osteoarthritis of both hands  Chronic pain of left knee  Primary osteoarthritis of right knee  Elevated beta-2    Anticardiolipin antibody positive  Chronic midline low back pain without sciatica  Bilateral pulmonary embolism (HCC)  Facial rash  Muscle spasm of back  Obstructive sleep apnea syndrome  History of depression  Orders: No orders of the defined types were placed in this encounter.  No orders of the defined types were placed in this encounter.   Face-to-face time spent with patient was *** minutes. Greater than 50% of time was spent in counseling and coordination of care.  Follow-Up Instructions: No follow-ups on file.   Waddell CHRISTELLA Craze, PA-C  Note - This record has been created using Dragon software.  Chart creation errors have been sought, but may not always  have been located. Such creation errors do not reflect on  the standard of medical care.

## 2023-10-08 ENCOUNTER — Ambulatory Visit: Payer: Managed Care, Other (non HMO) | Admitting: Physician Assistant

## 2023-10-08 DIAGNOSIS — Z79899 Other long term (current) drug therapy: Secondary | ICD-10-CM

## 2023-10-08 DIAGNOSIS — M1711 Unilateral primary osteoarthritis, right knee: Secondary | ICD-10-CM

## 2023-10-08 DIAGNOSIS — M19041 Primary osteoarthritis, right hand: Secondary | ICD-10-CM

## 2023-10-08 DIAGNOSIS — G4733 Obstructive sleep apnea (adult) (pediatric): Secondary | ICD-10-CM

## 2023-10-08 DIAGNOSIS — R7989 Other specified abnormal findings of blood chemistry: Secondary | ICD-10-CM

## 2023-10-08 DIAGNOSIS — M62830 Muscle spasm of back: Secondary | ICD-10-CM

## 2023-10-08 DIAGNOSIS — G8929 Other chronic pain: Secondary | ICD-10-CM

## 2023-10-08 DIAGNOSIS — Z8659 Personal history of other mental and behavioral disorders: Secondary | ICD-10-CM

## 2023-10-08 DIAGNOSIS — I2699 Other pulmonary embolism without acute cor pulmonale: Secondary | ICD-10-CM

## 2023-10-08 DIAGNOSIS — M0579 Rheumatoid arthritis with rheumatoid factor of multiple sites without organ or systems involvement: Secondary | ICD-10-CM

## 2023-10-08 DIAGNOSIS — R760 Raised antibody titer: Secondary | ICD-10-CM

## 2023-10-08 DIAGNOSIS — R21 Rash and other nonspecific skin eruption: Secondary | ICD-10-CM

## 2024-01-13 ENCOUNTER — Encounter: Payer: Self-pay | Admitting: Radiology
